# Patient Record
Sex: Male | Born: 1945 | Race: White | Hispanic: No | Smoking: Former smoker
Health system: Southern US, Community
[De-identification: ages and names within clinical notes are randomized; demographics above are authoritative.]

## PROBLEM LIST (undated history)

## (undated) DIAGNOSIS — I219 Acute myocardial infarction, unspecified: Secondary | ICD-10-CM

## (undated) DIAGNOSIS — Z952 Presence of prosthetic heart valve: Secondary | ICD-10-CM

## (undated) DIAGNOSIS — C61 Malignant neoplasm of prostate: Secondary | ICD-10-CM

## (undated) DIAGNOSIS — T7840XA Allergy, unspecified, initial encounter: Secondary | ICD-10-CM

## (undated) DIAGNOSIS — I2510 Atherosclerotic heart disease of native coronary artery without angina pectoris: Secondary | ICD-10-CM

## (undated) DIAGNOSIS — F329 Major depressive disorder, single episode, unspecified: Secondary | ICD-10-CM

## (undated) DIAGNOSIS — C679 Malignant neoplasm of bladder, unspecified: Secondary | ICD-10-CM

## (undated) DIAGNOSIS — I714 Abdominal aortic aneurysm, without rupture: Secondary | ICD-10-CM

## (undated) DIAGNOSIS — F32A Depression, unspecified: Secondary | ICD-10-CM

## (undated) DIAGNOSIS — I4891 Unspecified atrial fibrillation: Secondary | ICD-10-CM

## (undated) HISTORY — PX: CARDIAC SURGERY: SHX584

## (undated) HISTORY — DX: Allergy, unspecified, initial encounter: T78.40XA

## (undated) HISTORY — PX: BLADDER SURGERY: SHX569

## (undated) HISTORY — DX: Malignant neoplasm of prostate: C61

---

## 2009-04-26 DIAGNOSIS — Z952 Presence of prosthetic heart valve: Secondary | ICD-10-CM

## 2009-04-26 HISTORY — DX: Presence of prosthetic heart valve: Z95.2

## 2014-09-30 ENCOUNTER — Telehealth: Payer: Self-pay | Admitting: Surgery

## 2014-09-30 NOTE — Telephone Encounter (Signed)
When calling the pt to advise of sx date, Pt stated that he would like to cancel sx until later in the year (September). Pt stated that he will call when he is ready to reschedule. Dr Burt Knack, OR and pre admit have been informed of cancellation.

## 2014-10-01 ENCOUNTER — Other Ambulatory Visit: Payer: Self-pay

## 2014-10-03 ENCOUNTER — Other Ambulatory Visit: Payer: Self-pay

## 2014-10-11 ENCOUNTER — Ambulatory Visit: Admission: RE | Admit: 2014-10-11 | Payer: Self-pay | Source: Ambulatory Visit | Admitting: Surgery

## 2014-10-11 ENCOUNTER — Encounter: Admission: RE | Payer: Self-pay | Source: Ambulatory Visit

## 2014-10-11 ENCOUNTER — Ambulatory Visit: Admit: 2014-10-11 | Payer: Self-pay | Admitting: Surgery

## 2014-10-11 SURGERY — REPAIR, HERNIA, UMBILICAL, ADULT
Anesthesia: Choice

## 2016-09-01 DIAGNOSIS — C679 Malignant neoplasm of bladder, unspecified: Secondary | ICD-10-CM | POA: Insufficient documentation

## 2016-09-01 NOTE — Progress Notes (Signed)
Gabriel Robbins  [phone] [phone]  ID: Gabriel Robbins OB: April 24, 1946  MR#: [id number]  EQA#:[id number]  Patient Care Team: [name], NP as PCP - General (Family Medicine)  CHIEF COMPLAINT: Stage IIIA high-grade urothelial carcinoma.  INTERVAL HISTORY: Patient is a 71 year old male who initially presented to the [facility] with hematuria. Subsequent workup included CT scan as well as cystoscopy that revealed the above stated bladder cancer. Patient is status post TURBT. Prostatic biopsy was also positive for high-grade urothelial carcinoma. Currently, he feels well and is asymptomatic. He has no neurologic complaints. He denies any recent fevers or illnesses. He has a good appetite and denies weight loss. He has no chest pain or shortness of breath. He denies any nausea, vomiting, constipation, or diarrhea. He has no further urinary complaints. Patient feels at his baseline and offers no specific complaints today.  REVIEW OF SYSTEMS:   Review of Systems  Constitutional: Negative.  Negative for fever, malaise/fatigue and weight loss.  Respiratory: Negative.  Negative for shortness of breath.   Cardiovascular: Negative.  Negative for chest pain and leg swelling.  Gastrointestinal: Negative.  Negative for abdominal pain.  Genitourinary: Positive for hematuria.  Musculoskeletal: Negative.   Skin: Negative.  Negative for rash.  Neurological: Negative.  Negative for weakness.  Psychiatric/Behavioral: Negative.  The patient is not nervous/anxious.     As per HPI. Otherwise, a complete review of systems is negative.  PAST MEDICAL HISTORY: Past Medical History:  Diagnosis Date  . Allergy   . Prostate cancer ([place])     PAST SURGICAL HISTORY: Past Surgical History:  Procedure Laterality Date  . BLADDER SURGERY    . CARDIAC SURGERY      FAMILY HISTORY: Family History  Problem Relation Age of Onset  . Thyroid disease Mother   .  Heart attack Father   . Heart disease Brother   . Parkinson's disease Brother     ADVANCED DIRECTIVES (Y/N):  N  HEALTH MAINTENANCE: Social History  Substance Use Topics  . Smoking status: Current Every Day Smoker    Packs/day: 0.25    Years: 50.00  . Smokeless tobacco: Never Used  . Alcohol use Yes     Comment: very rare     Colonoscopy:  PAP:  Bone density:  Lipid panel:  Allergies  Allergen Reactions  . Penicillin G Other (See Comments)    Current Outpatient Prescriptions  Medication Sig Dispense Refill  . aspirin EC 81 MG tablet Take 81 mg by mouth daily.     Marland Kitchen atorvastatin (LIPITOR) 20 MG tablet Take 20 mg by mouth daily.    . carvedilol (COREG) 25 MG tablet Take 25 mg by mouth 2 (two) times daily with a meal.     . tamsulosin (FLOMAX) 0.4 MG CAPS capsule Take 0.4 mg by mouth 2 (two) times daily.     No current facility-administered medications for this visit.     OBJECTIVE: Vitals:   09/03/16 0852  BP: 132/83  Pulse: 70  Resp: 18  Temp: 98.2 F (36.8 C)     Body mass index is 28.75 kg/m.    ECOG FS:0 - Asymptomatic  General: Well-developed, well-nourished, no acute distress. Eyes: Pink conjunctiva, anicteric sclera. HEENT: Normocephalic, moist mucous membranes, clear oropharnyx. Lungs: Clear to auscultation bilaterally. Heart: Regular rate and rhythm. No rubs, murmurs, or gallops. Abdomen: Soft, nontender, nondistended. No organomegaly noted, normoactive bowel sounds. Musculoskeletal: No edema, cyanosis, or clubbing. Neuro: Alert, answering all questions appropriately. Cranial  nerves grossly intact. Skin: No rashes or petechiae noted. Psych: Normal affect. Lymphatics: No cervical, calvicular, axillary or inguinal LAD.   LAB RESULTS:  No results found for: NA, K, CL, CO2, GLUCOSE, BUN, CREATININE, CALCIUM, PROT, ALBUMIN, AST, ALT, ALKPHOS, BILITOT, GFRNONAA, GFRAA  No results found for: WBC, NEUTROABS, HGB, HCT, MCV, PLT   STUDIES: No results  found.  ASSESSMENT: Stage IIIA high-grade urothelial carcinoma.  PLAN:    1. Stage IIIA high-grade urothelial carcinoma: Outside CT scans and biopsy results reviewed independently confirming diagnosis and stage. Patient reports he has a PET scan scheduled at the [place] next week to complete the staging workup. We will obtain the slides from his biopsy and imaging results. Agree with outside recommendation of neoadjuvant chemotherapy using cisplatin and gemcitabine for 3 cycles followed by complete cystectomy. Currently, patient is scheduled for surgery at the [facility] but is considering a local urologist. A referral will be made later at the patient's request. Patient will receive cisplatin and gemcitabine on day 1 followed by gemcitabine only on days 8 and day 15. This will be a 28 day cycle. Patient has requested to delay treatment several weeks in order to complete multiple previously scheduled appointments. Therefore, he will return to clinic on September 29, 2016 to initiate cycle 1, day 1.  Approximately 60 minutes was spent in discussion of which greater than 50% was consultation.  Patient expressed understanding and was in agreement with this plan. He also understands that He can call clinic at any time with any questions, concerns, or complaints.   Cancer Staging Bladder cancer [facility]) Staging form: Urinary Bladder, AJCC 8th Edition - Clinical stage from 09/04/2016: Stage IIIA (cT4a, cN0, cM0) - Signed by [name], MD on 09/04/2016   [name], MD   09/04/2016 8:49 AM

## 2016-09-03 ENCOUNTER — Inpatient Hospital Stay: Payer: Non-veteran care | Attending: Oncology | Admitting: Oncology

## 2016-09-03 ENCOUNTER — Ambulatory Visit: Payer: Self-pay | Admitting: Oncology

## 2016-09-03 ENCOUNTER — Encounter: Payer: Self-pay | Admitting: Oncology

## 2016-09-03 DIAGNOSIS — R319 Hematuria, unspecified: Secondary | ICD-10-CM | POA: Insufficient documentation

## 2016-09-03 DIAGNOSIS — F17210 Nicotine dependence, cigarettes, uncomplicated: Secondary | ICD-10-CM | POA: Diagnosis not present

## 2016-09-03 DIAGNOSIS — Z7982 Long term (current) use of aspirin: Secondary | ICD-10-CM | POA: Insufficient documentation

## 2016-09-03 DIAGNOSIS — C680 Malignant neoplasm of urethra: Secondary | ICD-10-CM | POA: Diagnosis not present

## 2016-09-03 DIAGNOSIS — Z8546 Personal history of malignant neoplasm of prostate: Secondary | ICD-10-CM | POA: Insufficient documentation

## 2016-09-03 DIAGNOSIS — C679 Malignant neoplasm of bladder, unspecified: Secondary | ICD-10-CM

## 2016-09-04 NOTE — Progress Notes (Signed)
START ON PATHWAY REGIMEN - Bladder     A cycle is every 21 days:     Gemcitabine      Cisplatin   **Always confirm dose/schedule in your pharmacy ordering system**    Patient Characteristics: Pre Cystectomy, Clinical T2-T4a, N0-1, M0, Cystectomy Eligible, Cisplatin-Based Chemotherapy Indicated (CrCl >= 50 mL/min and Minimal or No Symptoms) AJCC M Category: M0 AJCC N Category: N0 AJCC T Category: T4a Current evidence of distant metastases? No AJCC 8 Stage Grouping: IIIA  Intent of Therapy: Curative Intent, Discussed with Patient

## 2016-09-06 ENCOUNTER — Telehealth: Payer: Self-pay

## 2016-09-06 NOTE — Telephone Encounter (Signed)
-----   Message from [name], MD sent at 09/06/2016  8:55 AM EDT ----- Regarding: RE: port placement He was going to get back to me.  If he doesn't let us know by the end of the week, could you give him a call?  Thanks!  ----- Message ----- From: Luretha Murphy, CMA Sent: 09/06/2016   8:11 AM To: [name], MD Subject: port placement                                 Did you want patient to have a port placed if so where?

## 2016-09-06 NOTE — Addendum Note (Signed)
Addended by: Luretha Murphy on: 09/06/2016 09:47 AM   Modules accepted: Orders

## 2016-09-06 NOTE — Telephone Encounter (Signed)
Referral placed to [facility] for port placement tried calling patient but I left a voicemail

## 2016-09-07 ENCOUNTER — Encounter (INDEPENDENT_AMBULATORY_CARE_PROVIDER_SITE_OTHER): Payer: Self-pay

## 2016-09-07 ENCOUNTER — Ambulatory Visit
Admission: RE | Admit: 2016-09-07 | Discharge: 2016-09-07 | Disposition: A | Discharge status: Home / Self Care | Payer: Self-pay | Source: Ambulatory Visit | Attending: Oncology | Admitting: Oncology

## 2016-09-07 ENCOUNTER — Other Ambulatory Visit: Payer: Self-pay | Admitting: Oncology

## 2016-09-07 DIAGNOSIS — C679 Malignant neoplasm of bladder, unspecified: Secondary | ICD-10-CM

## 2016-09-14 NOTE — Patient Instructions (Signed)
Cisplatin injection What is this medicine? CISPLATIN (SIS pla tin) is a chemotherapy drug. It targets fast dividing cells, like cancer cells, and causes these cells to die. This medicine is used to treat many types of cancer like bladder, ovarian, and testicular cancers. This medicine may be used for other purposes; ask your health care provider or pharmacist if you have questions. COMMON BRAND NAME(S): Platinol, Platinol -AQ What should I tell my health care provider before I take this medicine? They need to know if you have any of these conditions: -blood disorders -hearing problems -kidney disease -recent or ongoing radiation therapy -an unusual or allergic reaction to cisplatin, carboplatin, other chemotherapy, other medicines, foods, dyes, or preservatives -pregnant or trying to get pregnant -breast-feeding How should I use this medicine? This drug is given as an infusion into a vein. It is administered in a hospital or clinic by a specially trained health care professional. Talk to your pediatrician regarding the use of this medicine in children. Special care may be needed. Overdosage: If you think you have taken too much of this medicine contact a poison control center or emergency room at once. NOTE: This medicine is only for you. Do not share this medicine with others. What if I miss a dose? It is important not to miss a dose. Call your doctor or health care professional if you are unable to keep an appointment. What may interact with this medicine? -dofetilide -foscarnet -medicines for seizures -medicines to increase blood counts like filgrastim, pegfilgrastim, sargramostim -probenecid -pyridoxine used with altretamine -rituximab -some antibiotics like amikacin, gentamicin, neomycin, polymyxin B, streptomycin, tobramycin -sulfinpyrazone -vaccines -zalcitabine Talk to your doctor or health care professional before taking any of these  medicines: -acetaminophen -aspirin -ibuprofen -ketoprofen -naproxen This list may not describe all possible interactions. Give your health care provider a list of all the medicines, herbs, non-prescription drugs, or dietary supplements you use. Also tell them if you smoke, drink alcohol, or use illegal drugs. Some items may interact with your medicine. What should I watch for while using this medicine? Your condition will be monitored carefully while you are receiving this medicine. You will need important blood work done while you are taking this medicine. This drug may make you feel generally unwell. This is not uncommon, as chemotherapy can affect healthy cells as well as cancer cells. Report any side effects. Continue your course of treatment even though you feel ill unless your doctor tells you to stop. In some cases, you may be given additional medicines to help with side effects. Follow all directions for their use. Call your doctor or health care professional for advice if you get a fever, chills or sore throat, or other symptoms of a cold or flu. Do not treat yourself. This drug decreases your body's ability to fight infections. Try to avoid being around people who are sick. This medicine may increase your risk to bruise or bleed. Call your doctor or health care professional if you notice any unusual bleeding. Be careful brushing and flossing your teeth or using a toothpick because you may get an infection or bleed more easily. If you have any dental work done, tell your dentist you are receiving this medicine. Avoid taking products that contain aspirin, acetaminophen, ibuprofen, naproxen, or ketoprofen unless instructed by your doctor. These medicines may hide a fever. Do not become pregnant while taking this medicine. Women should inform their doctor if they wish to become pregnant or think they might be pregnant. There is a   potential for serious side effects to an unborn child. Talk to  your health care professional or pharmacist for more information. Do not breast-feed an infant while taking this medicine. Drink fluids as directed while you are taking this medicine. This will help protect your kidneys. Call your doctor or health care professional if you get diarrhea. Do not treat yourself. What side effects may I notice from receiving this medicine? Side effects that you should report to your doctor or health care professional as soon as possible: -allergic reactions like skin rash, itching or hives, swelling of the face, lips, or tongue -signs of infection - fever or chills, cough, sore throat, pain or difficulty passing urine -signs of decreased platelets or bleeding - bruising, pinpoint red spots on the skin, black, tarry stools, nosebleeds -signs of decreased red blood cells - unusually weak or tired, fainting spells, lightheadedness -breathing problems -changes in hearing -gout pain -low blood counts - This drug may decrease the number of white blood cells, red blood cells and platelets. You may be at increased risk for infections and bleeding. -nausea and vomiting -pain, swelling, redness or irritation at the injection site -pain, tingling, numbness in the hands or feet -problems with balance, movement -trouble passing urine or change in the amount of urine Side effects that usually do not require medical attention (report to your doctor or health care professional if they continue or are bothersome): -changes in vision -loss of appetite -metallic taste in the mouth or changes in taste This list may not describe all possible side effects. Call your doctor for medical advice about side effects. You may report side effects to FDA at 1-800-FDA-1088. Where should I keep my medicine? This drug is given in a hospital or clinic and will not be stored at home. NOTE: This sheet is a summary. It may not cover all possible information. If you have questions about this medicine,  talk to your doctor, pharmacist, or health care provider.  2018 Elsevier/Gold Standard (2007-07-18 14:40:54) Gemcitabine injection What is this medicine? GEMCITABINE (jem SIT a been) is a chemotherapy drug. This medicine is used to treat many types of cancer like breast cancer, lung cancer, pancreatic cancer, and ovarian cancer. This medicine may be used for other purposes; ask your health care provider or pharmacist if you have questions. COMMON BRAND NAME(S): Gemzar What should I tell my health care provider before I take this medicine? They need to know if you have any of these conditions: -blood disorders -infection -kidney disease -liver disease -recent or ongoing radiation therapy -an unusual or allergic reaction to gemcitabine, other chemotherapy, other medicines, foods, dyes, or preservatives -pregnant or trying to get pregnant -breast-feeding How should I use this medicine? This drug is given as an infusion into a vein. It is administered in a hospital or clinic by a specially trained health care professional. Talk to your pediatrician regarding the use of this medicine in children. Special care may be needed. Overdosage: If you think you have taken too much of this medicine contact a poison control center or emergency room at once. NOTE: This medicine is only for you. Do not share this medicine with others. What if I miss a dose? It is important not to miss your dose. Call your doctor or health care professional if you are unable to keep an appointment. What may interact with this medicine? -medicines to increase blood counts like filgrastim, pegfilgrastim, sargramostim -some other chemotherapy drugs like cisplatin -vaccines Talk to your doctor or health   care professional before taking any of these medicines: -acetaminophen -aspirin -ibuprofen -ketoprofen -naproxen This list may not describe all possible interactions. Give your health care provider a list of all the  medicines, herbs, non-prescription drugs, or dietary supplements you use. Also tell them if you smoke, drink alcohol, or use illegal drugs. Some items may interact with your medicine. What should I watch for while using this medicine? Visit your doctor for checks on your progress. This drug may make you feel generally unwell. This is not uncommon, as chemotherapy can affect healthy cells as well as cancer cells. Report any side effects. Continue your course of treatment even though you feel ill unless your doctor tells you to stop. In some cases, you may be given additional medicines to help with side effects. Follow all directions for their use. Call your doctor or health care professional for advice if you get a fever, chills or sore throat, or other symptoms of a cold or flu. Do not treat yourself. This drug decreases your body's ability to fight infections. Try to avoid being around people who are sick. This medicine may increase your risk to bruise or bleed. Call your doctor or health care professional if you notice any unusual bleeding. Be careful brushing and flossing your teeth or using a toothpick because you may get an infection or bleed more easily. If you have any dental work done, tell your dentist you are receiving this medicine. Avoid taking products that contain aspirin, acetaminophen, ibuprofen, naproxen, or ketoprofen unless instructed by your doctor. These medicines may hide a fever. Women should inform their doctor if they wish to become pregnant or think they might be pregnant. There is a potential for serious side effects to an unborn child. Talk to your health care professional or pharmacist for more information. Do not breast-feed an infant while taking this medicine. What side effects may I notice from receiving this medicine? Side effects that you should report to your doctor or health care professional as soon as possible: -allergic reactions like skin rash, itching or hives,  swelling of the face, lips, or tongue -low blood counts - this medicine may decrease the number of white blood cells, red blood cells and platelets. You may be at increased risk for infections and bleeding. -signs of infection - fever or chills, cough, sore throat, pain or difficulty passing urine -signs of decreased platelets or bleeding - bruising, pinpoint red spots on the skin, black, tarry stools, blood in the urine -signs of decreased red blood cells - unusually weak or tired, fainting spells, lightheadedness -breathing problems -chest pain -mouth sores -nausea and vomiting -pain, swelling, redness at site where injected -pain, tingling, numbness in the hands or feet -stomach pain -swelling of ankles, feet, hands -unusual bleeding Side effects that usually do not require medical attention (report to your doctor or health care professional if they continue or are bothersome): -constipation -diarrhea -hair loss -loss of appetite -stomach upset This list may not describe all possible side effects. Call your doctor for medical advice about side effects. You may report side effects to FDA at 1-800-FDA-1088. Where should I keep my medicine? This drug is given in a hospital or clinic and will not be stored at home. NOTE: This sheet is a summary. It may not cover all possible information. If you have questions about this medicine, talk to your doctor, pharmacist, or health care provider.  2018 Elsevier/Gold Standard (2007-08-22 18:45:54)  

## 2016-09-15 ENCOUNTER — Inpatient Hospital Stay: Payer: Non-veteran care

## 2016-09-21 ENCOUNTER — Telehealth (INDEPENDENT_AMBULATORY_CARE_PROVIDER_SITE_OTHER): Payer: Self-pay

## 2016-09-21 ENCOUNTER — Other Ambulatory Visit (INDEPENDENT_AMBULATORY_CARE_PROVIDER_SITE_OTHER): Payer: Self-pay | Admitting: Vascular Surgery

## 2016-09-21 MED ORDER — CLINDAMYCIN PHOSPHATE 600 MG/50ML IV SOLN
600.0000 mg | Freq: Once | INTRAVENOUS | Status: AC
  Administered 2016-09-22: 600 mg via INTRAVENOUS
  Filled 2016-09-21: qty 50

## 2016-09-21 NOTE — Telephone Encounter (Signed)
Attempted to contact patient to reschedule his port placement that is scheduled for 09/27/16 to 09/22/16 due to the doctor not being available on that day. Left a message for patient to contact me.

## 2016-09-21 NOTE — Telephone Encounter (Signed)
I have spoken with the patient and rescheduled his procedure for 09/22/16.

## 2016-09-22 ENCOUNTER — Encounter
Admission: RE | Disposition: A | Discharge status: Home / Self Care | Payer: Self-pay | Source: Ambulatory Visit | Attending: Vascular Surgery

## 2016-09-22 ENCOUNTER — Ambulatory Visit
Admission: RE | Admit: 2016-09-22 | Discharge: 2016-09-22 | Disposition: A | Discharge status: Home / Self Care | Payer: Medicare Other | Source: Ambulatory Visit | Attending: Vascular Surgery | Admitting: Vascular Surgery

## 2016-09-22 DIAGNOSIS — Z7982 Long term (current) use of aspirin: Secondary | ICD-10-CM | POA: Insufficient documentation

## 2016-09-22 DIAGNOSIS — Z8349 Family history of other endocrine, nutritional and metabolic diseases: Secondary | ICD-10-CM | POA: Insufficient documentation

## 2016-09-22 DIAGNOSIS — Z8249 Family history of ischemic heart disease and other diseases of the circulatory system: Secondary | ICD-10-CM | POA: Insufficient documentation

## 2016-09-22 DIAGNOSIS — Z8546 Personal history of malignant neoplasm of prostate: Secondary | ICD-10-CM | POA: Insufficient documentation

## 2016-09-22 DIAGNOSIS — F17210 Nicotine dependence, cigarettes, uncomplicated: Secondary | ICD-10-CM | POA: Insufficient documentation

## 2016-09-22 DIAGNOSIS — C679 Malignant neoplasm of bladder, unspecified: Secondary | ICD-10-CM

## 2016-09-22 DIAGNOSIS — Z820 Family history of epilepsy and other diseases of the nervous system: Secondary | ICD-10-CM | POA: Insufficient documentation

## 2016-09-22 DIAGNOSIS — Z9889 Other specified postprocedural states: Secondary | ICD-10-CM | POA: Insufficient documentation

## 2016-09-22 DIAGNOSIS — Z880 Allergy status to penicillin: Secondary | ICD-10-CM | POA: Insufficient documentation

## 2016-09-22 HISTORY — DX: Acute myocardial infarction, unspecified: I21.9

## 2016-09-22 HISTORY — DX: Depression, unspecified: F32.A

## 2016-09-22 HISTORY — DX: Major depressive disorder, single episode, unspecified: F32.9

## 2016-09-22 HISTORY — PX: PORTA CATH INSERTION: CATH118285

## 2016-09-22 HISTORY — DX: Atherosclerotic heart disease of native coronary artery without angina pectoris: I25.10

## 2016-09-22 SURGERY — PORTA CATH INSERTION
Anesthesia: Moderate Sedation

## 2016-09-22 MED ORDER — MIDAZOLAM HCL 2 MG/2ML IJ SOLN
INTRAMUSCULAR | Status: DC | PRN
  Administered 2016-09-22: 2 mg via INTRAVENOUS

## 2016-09-22 MED ORDER — DIPHENHYDRAMINE HCL 50 MG/ML IJ SOLN
INTRAMUSCULAR | Status: AC
  Filled 2016-09-22: qty 1

## 2016-09-22 MED ORDER — LIDOCAINE-EPINEPHRINE (PF) 2 %-1:200000 IJ SOLN
INTRAMUSCULAR | Status: AC
  Filled 2016-09-22: qty 20

## 2016-09-22 MED ORDER — OXYCODONE-ACETAMINOPHEN 5-325 MG PO TABS: 1.0000 | ORAL_TABLET | ORAL | Status: DC | PRN

## 2016-09-22 MED ORDER — PHENOL 1.4 % MT LIQD: 1.0000 | OROMUCOSAL | Status: DC | PRN

## 2016-09-22 MED ORDER — ALUM & MAG HYDROXIDE-SIMETH 200-200-20 MG/5ML PO SUSP: 15.0000 mL | ORAL | Status: DC | PRN

## 2016-09-22 MED ORDER — METOPROLOL TARTRATE 5 MG/5ML IV SOLN: 2.0000 mg | INTRAVENOUS | Status: DC | PRN

## 2016-09-22 MED ORDER — ACETAMINOPHEN 325 MG RE SUPP: 325.0000 mg | RECTAL | Status: DC | PRN

## 2016-09-22 MED ORDER — MORPHINE SULFATE (PF) 4 MG/ML IV SOLN: 2.0000 mg | INTRAVENOUS | Status: DC | PRN

## 2016-09-22 MED ORDER — SODIUM CHLORIDE 0.9 % IV SOLN
INTRAVENOUS | Status: DC
  Administered 2016-09-22: 07:00:00 via INTRAVENOUS

## 2016-09-22 MED ORDER — HYDROMORPHONE HCL 1 MG/ML IJ SOLN: 1.0000 mg | Freq: Once | INTRAMUSCULAR | Status: DC | PRN

## 2016-09-22 MED ORDER — SODIUM CHLORIDE 0.9 % IV SOLN: 500.0000 mL | Freq: Once | INTRAVENOUS | Status: DC | PRN

## 2016-09-22 MED ORDER — GUAIFENESIN-DM 100-10 MG/5ML PO SYRP: 15.0000 mL | ORAL_SOLUTION | ORAL | Status: DC | PRN

## 2016-09-22 MED ORDER — LABETALOL HCL 5 MG/ML IV SOLN: 10.0000 mg | INTRAVENOUS | Status: DC | PRN

## 2016-09-22 MED ORDER — DIPHENHYDRAMINE HCL 50 MG/ML IJ SOLN
INTRAMUSCULAR | Status: DC | PRN
  Administered 2016-09-22: 25 mg via INTRAVENOUS

## 2016-09-22 MED ORDER — MIDAZOLAM HCL 5 MG/5ML IJ SOLN
INTRAMUSCULAR | Status: AC
  Filled 2016-09-22: qty 5

## 2016-09-22 MED ORDER — CLINDAMYCIN PHOSPHATE 300 MG/50ML IV SOLN
INTRAVENOUS | Status: AC
  Filled 2016-09-22: qty 50

## 2016-09-22 MED ORDER — HYDRALAZINE HCL 20 MG/ML IJ SOLN: 5.0000 mg | INTRAMUSCULAR | Status: DC | PRN

## 2016-09-22 MED ORDER — SODIUM CHLORIDE 0.9 % IR SOLN
Freq: Once | Status: AC
  Administered 2016-09-22: 09:00:00
  Filled 2016-09-22: qty 2

## 2016-09-22 MED ORDER — ONDANSETRON HCL 4 MG/2ML IJ SOLN: 4.0000 mg | Freq: Four times a day (QID) | INTRAMUSCULAR | Status: DC | PRN

## 2016-09-22 MED ORDER — ACETAMINOPHEN 325 MG PO TABS: 325.0000 mg | ORAL_TABLET | ORAL | Status: DC | PRN

## 2016-09-22 MED ORDER — ONDANSETRON HCL 4 MG/2ML IJ SOLN
4.0000 mg | Freq: Four times a day (QID) | INTRAMUSCULAR | Status: DC | PRN
Start: 2016-09-22 — End: 2016-09-22

## 2016-09-22 SURGICAL SUPPLY — 9 items
DERMABOND ADVANCED (GAUZE/BANDAGES/DRESSINGS) ×2
DERMABOND ADVANCED .7 DNX12 (GAUZE/BANDAGES/DRESSINGS) ×1 IMPLANT
KIT PORT POWER 8FR ISP CVUE (Catheter) ×3 IMPLANT
PACK ANGIOGRAPHY (CUSTOM PROCEDURE TRAY) ×3 IMPLANT
PAD GROUND ADULT SPLIT (MISCELLANEOUS) ×3 IMPLANT
PENCIL ELECTRO HAND CTR (MISCELLANEOUS) ×3 IMPLANT
SUT MNCRL AB 4-0 PS2 18 (SUTURE) ×3 IMPLANT
SUT PROLENE 0 CT 1 30 (SUTURE) ×3 IMPLANT
SUTURE VIC 3-0 (SUTURE) ×3 IMPLANT

## 2016-09-22 NOTE — Progress Notes (Signed)
Pt drank 3 large sips of coffee with cream, [facility] lab aware and paging Dr Lucky Cowboy.

## 2016-09-22 NOTE — Op Note (Signed)
      [facility]       Operative Note  Date: 09/22/2016  Preoperative diagnosis:  1. Bladder cancer  Postoperative diagnosis:  Same as above  Procedures: #1. Ultrasound guidance for vascular access to the right internal jugular vein. #2. Fluoroscopic guidance for placement of catheter. #3. Placement of CT compatible Port-A-Cath, right internal jugular vein.  Surgeon: Leotis Pain, MD.   Anesthesia: Local with moderate conscious sedation for approximately 20  minutes using 2 mg of Versed and 25 mg of Benadryl  Fluoroscopy time: less than 1 minute  Contrast used: 0  Estimated blood loss: 10 cc  Indication for the procedure:  The patient is a 71 y.o.male with bladder cancer.  The patient needs a Port-A-Cath for durable venous access, chemotherapy, lab draws, and CT scans. We are asked to place this. Risks and benefits were discussed and informed consent was obtained.  Description of procedure: The patient was brought to the vascular and interventional radiology suite.  Moderate conscious sedation was administered throughout the procedure during a face to face encounter with the patient with my supervision of the RN administering medicines and monitoring the patient's vital signs, pulse oximetry, telemetry and mental status throughout from the start of the procedure until the patient was taken to the recovery room. The right neck chest and shoulder were sterilely prepped and draped, and a sterile surgical field was created. Ultrasound was used to help visualize a patent right internal jugular vein. This was then accessed under direct ultrasound guidance without difficulty with the Seldinger needle and a permanent image was recorded. A J-wire was placed. After skin nick and dilatation, the peel-away sheath was then placed over the wire. I then anesthetized an area under the clavicle approximately 1-2 fingerbreadths. A transverse incision was created and an inferior pocket was  created with electrocautery and blunt dissection. The port was then brought onto the field, placed into the pocket and secured to the chest wall with 2 Prolene sutures. The catheter was connected to the port and tunneled from the subclavicular incision to the access site. Fluoroscopic guidance was then used to cut the catheter to an appropriate length. The catheter was then placed through the peel-away sheath and the peel-away sheath was removed. The catheter tip was parked in excellent location under fluorocoscopic guidance in the cavoatrial junction. The pocket was then irrigated with antibiotic impregnated saline and the wound was closed with a running 3-0 Vicryl and a 4-0 Monocryl. The access incision was closed with a single 4-0 Monocryl. The Huber needle was used to withdraw blood and flush the port with heparinized saline. Dermabond was then placed as a dressing. The patient tolerated the procedure well and was taken to the recovery room in stable condition.   Leotis Pain 09/22/2016 9:03 AM   This note was created with Dragon Medical transcription system. Any errors in dictation are purely unintentional.

## 2016-09-22 NOTE — Progress Notes (Signed)
Call back , pt to be given versed only for procedure

## 2016-09-22 NOTE — H&P (Signed)
[facility] History & Physical Update  The patient was interviewed and re-examined.  The patient's previous History and Physical has been reviewed and is unchanged.  There is no change in the plan of care. We plan to proceed with the scheduled procedure.  Leotis Pain, MD  09/22/2016, 7:53 AM

## 2016-09-23 ENCOUNTER — Inpatient Hospital Stay: Payer: Non-veteran care

## 2016-09-23 ENCOUNTER — Encounter: Payer: Self-pay | Admitting: Vascular Surgery

## 2016-09-28 NOTE — Progress Notes (Signed)
[place]  [phone]) [phone] Fax:(336) [phone]  ID: Gabriel Robbins OB: Jan 13, 1946  MR#: [id number]  ZTI#:[id number]  Patient Care Team: [name], NP as PCP - General (Family Medicine)  CHIEF COMPLAINT: Stage IIIA high-grade urothelial carcinoma.  INTERVAL HISTORY: Patient returns to clinic today for further evaluation and consideration of cycle 1, day 1 of cisplatin and gemcitabine. He continues to feel well and at his baseline. He has no neurologic complaints. He denies any recent fevers or illnesses. He has a good appetite and denies weight loss. He has no chest pain or shortness of breath. He denies any nausea, vomiting, constipation, or diarrhea. He has no further urinary complaints. Patient offers no specific complaints today.  REVIEW OF SYSTEMS:   Review of Systems  Constitutional: Negative.  Negative for fever, malaise/fatigue and weight loss.  Respiratory: Negative.  Negative for shortness of breath.   Cardiovascular: Negative.  Negative for chest pain and leg swelling.  Gastrointestinal: Negative.  Negative for abdominal pain.  Genitourinary: Positive for hematuria.  Musculoskeletal: Negative.   Skin: Negative.  Negative for rash.  Neurological: Negative.  Negative for weakness.  Psychiatric/Behavioral: Negative.  The patient is not nervous/anxious.     As per HPI. Otherwise, a complete review of systems is negative.  PAST MEDICAL HISTORY: Past Medical History:  Diagnosis Date  . Allergy   . Coronary artery disease   . Depression   . Hypertension   . Myocardial infarction ([place])   . Prostate cancer ([place])     PAST SURGICAL HISTORY: Past Surgical History:  Procedure Laterality Date  . BLADDER SURGERY    . CARDIAC SURGERY    . PORTA CATH INSERTION N/A 09/22/2016   Procedure: Glori Luis Cath Insertion;  Surgeon: Algernon Huxley, MD;  Location: [facility];  Service: Cardiovascular;  Laterality: N/A;    FAMILY HISTORY: Family  History  Problem Relation Age of Onset  . Thyroid disease Mother   . Heart attack Father   . Heart disease Brother   . Parkinson's disease Brother     ADVANCED DIRECTIVES (Y/N):  N  HEALTH MAINTENANCE: Social History  Substance Use Topics  . Smoking status: Current Every Day Smoker    Packs/day: 0.50    Years: 50.00  . Smokeless tobacco: Never Used  . Alcohol use Yes     Comment: very rare     Colonoscopy:  PAP:  Bone density:  Lipid panel:  Allergies  Allergen Reactions  . Penicillin G Other (See Comments)    Current Outpatient Prescriptions  Medication Sig Dispense Refill  . acetaminophen (TYLENOL) 325 MG tablet Take 650 mg by mouth 2 (two) times daily as needed for mild pain or moderate pain.    Marland Kitchen albuterol (PROVENTIL HFA;VENTOLIN HFA) 108 (90 Base) MCG/ACT inhaler Inhale 2 puffs into the lungs every 4 (four) hours as needed for wheezing or shortness of breath.    Marland Kitchen ammonium lactate (LAC-HYDRIN) 12 % lotion Apply 1 application topically daily as needed for dry skin.    Marland Kitchen atorvastatin (LIPITOR) 40 MG tablet Take 20 mg by mouth at bedtime.     . carvedilol (COREG) 25 MG tablet Take 25 mg by mouth 2 (two) times daily with a meal.     . cetirizine (ZYRTEC) 10 MG tablet Take 10 mg by mouth daily.    . diclofenac sodium (VOLTAREN) 1 % GEL Apply 1 application topically daily as needed.    . DiphenhydrAMINE HCl (ZZZQUIL) 50 MG/30ML LIQD Take 15  mLs by mouth at bedtime as needed.    . fluticasone (FLONASE) 50 MCG/ACT nasal spray Place 2 sprays into both nostrils daily as needed for allergies or rhinitis.    . polyvinyl alcohol (LIQUIFILM TEARS) 1.4 % ophthalmic solution Place 1 drop into both eyes daily as needed for dry eyes.    . promethazine (PHENERGAN) 50 MG tablet Take 50 mg by mouth every 6 (six) hours as needed for nausea or vomiting.    . sertraline (ZOLOFT) 25 MG tablet Take 25 mg by mouth at bedtime.    . tamsulosin (FLOMAX) 0.4 MG CAPS capsule Take 0.4 mg by mouth 2  (two) times daily.    . Tiotropium Bromide-Olodaterol (STIOLTO RESPIMAT) 2.5-2.5 MCG/ACT AERS Inhale 2 puffs into the lungs every morning.    . traMADol (ULTRAM) 50 MG tablet Take 50 mg by mouth daily as needed for moderate pain.    . vitamin B-12 (CYANOCOBALAMIN) 1000 MCG tablet Take 1,000 mcg by mouth daily.    Marland Kitchen zolpidem (AMBIEN) 5 MG tablet Take 2.5 mg by mouth at bedtime as needed for sleep (VERY RARELY TAKES).    Marland Kitchen aspirin EC 81 MG tablet Take 81 mg by mouth daily.      No current facility-administered medications for this visit.     OBJECTIVE: Vitals:   09/29/16 0908  BP: 128/85  Pulse: 69  Resp: 16  Temp: 97.6 F (36.4 C)     Body mass index is 28.93 kg/m.    ECOG FS:0 - Asymptomatic  General: Well-developed, well-nourished, no acute distress. Eyes: Pink conjunctiva, anicteric sclera. Lungs: Clear to auscultation bilaterally. Heart: Regular rate and rhythm. No rubs, murmurs, or gallops. Abdomen: Soft, nontender, nondistended. No organomegaly noted, normoactive bowel sounds. Musculoskeletal: No edema, cyanosis, or clubbing. Neuro: Alert, answering all questions appropriately. Cranial nerves grossly intact. Skin: No rashes or petechiae noted. Psych: Normal affect.   LAB RESULTS:  Lab Results  Component Value Date   NA 138 09/29/2016   K 3.8 09/29/2016   CL 106 09/29/2016   CO2 27 09/29/2016   GLUCOSE 116 (H) 09/29/2016   BUN 23 (H) 09/29/2016   CREATININE 0.90 09/29/2016   CALCIUM 8.5 (L) 09/29/2016   PROT 6.6 09/29/2016   ALBUMIN 3.6 09/29/2016   AST 18 09/29/2016   ALT 13 (L) 09/29/2016   ALKPHOS 61 09/29/2016   BILITOT 0.7 09/29/2016   GFRNONAA >60 09/29/2016   GFRAA >60 09/29/2016    Lab Results  Component Value Date   WBC 6.3 09/29/2016   NEUTROABS 4.5 09/29/2016   HGB 13.7 09/29/2016   HCT 40.0 09/29/2016   MCV 85.4 09/29/2016   PLT 185 09/29/2016     STUDIES: No results found.  ASSESSMENT: Stage IIIA high-grade urothelial  carcinoma.  PLAN:    1. Stage IIIA high-grade urothelial carcinoma: Outside CT scans and biopsy results reviewed independently confirming diagnosis and stage. PET scan completed at the [facility] and we are trying to obtain these results. We will obtain the slides from his biopsy and imaging results. Agree with outside recommendation of neoadjuvant chemotherapy using cisplatin and gemcitabine for 3 cycles followed by complete cystectomy. Currently, patient is scheduled for surgery at the [facility] but has expressed desire for local urologist and a referral has been made. Patient will receive cisplatin and gemcitabine on day 1 followed by gemcitabine only on days 8 and day 15. This will be a 28 day cycle. Proceed with cycle 1, day 1 today. Return to clinic in 1 week  for consideration of cycle 1, day 8 which will be gemcitabine only. Next  Approximately 30 minutes was spent in discussion of which greater than 50% was consultation.  Patient expressed understanding and was in agreement with this plan. He also understands that He can call clinic at any time with any questions, concerns, or complaints.   Cancer Staging Bladder cancer [facility]) Staging form: Urinary Bladder, AJCC 8th Edition - Clinical stage from 09/04/2016: Stage IIIA (cT4a, cN0, cM0) - Signed by [name], MD on 09/04/2016   [name], MD   10/05/2016 12:50 PM

## 2016-09-29 ENCOUNTER — Inpatient Hospital Stay: Payer: Non-veteran care

## 2016-09-29 ENCOUNTER — Inpatient Hospital Stay: Payer: Non-veteran care | Attending: Oncology | Admitting: Oncology

## 2016-09-29 VITALS — BP 128/85 | HR 69 | Temp 97.6°F | Resp 16 | Wt 213.3 lb

## 2016-09-29 DIAGNOSIS — I2510 Atherosclerotic heart disease of native coronary artery without angina pectoris: Secondary | ICD-10-CM

## 2016-09-29 DIAGNOSIS — Z79899 Other long term (current) drug therapy: Secondary | ICD-10-CM | POA: Diagnosis not present

## 2016-09-29 DIAGNOSIS — C679 Malignant neoplasm of bladder, unspecified: Secondary | ICD-10-CM

## 2016-09-29 DIAGNOSIS — F329 Major depressive disorder, single episode, unspecified: Secondary | ICD-10-CM | POA: Diagnosis not present

## 2016-09-29 DIAGNOSIS — R531 Weakness: Secondary | ICD-10-CM | POA: Diagnosis not present

## 2016-09-29 DIAGNOSIS — F17210 Nicotine dependence, cigarettes, uncomplicated: Secondary | ICD-10-CM | POA: Insufficient documentation

## 2016-09-29 DIAGNOSIS — Z880 Allergy status to penicillin: Secondary | ICD-10-CM | POA: Diagnosis not present

## 2016-09-29 DIAGNOSIS — R5383 Other fatigue: Secondary | ICD-10-CM | POA: Diagnosis not present

## 2016-09-29 DIAGNOSIS — R5381 Other malaise: Secondary | ICD-10-CM | POA: Diagnosis not present

## 2016-09-29 DIAGNOSIS — Z8546 Personal history of malignant neoplasm of prostate: Secondary | ICD-10-CM | POA: Diagnosis not present

## 2016-09-29 DIAGNOSIS — J309 Allergic rhinitis, unspecified: Secondary | ICD-10-CM | POA: Insufficient documentation

## 2016-09-29 DIAGNOSIS — I10 Essential (primary) hypertension: Secondary | ICD-10-CM | POA: Diagnosis not present

## 2016-09-29 DIAGNOSIS — R319 Hematuria, unspecified: Secondary | ICD-10-CM | POA: Insufficient documentation

## 2016-09-29 DIAGNOSIS — D6959 Other secondary thrombocytopenia: Secondary | ICD-10-CM | POA: Diagnosis not present

## 2016-09-29 DIAGNOSIS — C674 Malignant neoplasm of posterior wall of bladder: Secondary | ICD-10-CM

## 2016-09-29 DIAGNOSIS — J449 Chronic obstructive pulmonary disease, unspecified: Secondary | ICD-10-CM | POA: Insufficient documentation

## 2016-09-29 DIAGNOSIS — Z7982 Long term (current) use of aspirin: Secondary | ICD-10-CM | POA: Diagnosis not present

## 2016-09-29 DIAGNOSIS — I252 Old myocardial infarction: Secondary | ICD-10-CM | POA: Insufficient documentation

## 2016-09-29 DIAGNOSIS — T451X5S Adverse effect of antineoplastic and immunosuppressive drugs, sequela: Secondary | ICD-10-CM | POA: Insufficient documentation

## 2016-09-29 DIAGNOSIS — Z5111 Encounter for antineoplastic chemotherapy: Secondary | ICD-10-CM | POA: Diagnosis not present

## 2016-09-29 LAB — CBC WITH DIFFERENTIAL/PLATELET
BASOS ABS: 0 10*3/uL (ref 0–0.1)
BASOS PCT: 1 %
EOS ABS: 0.2 10*3/uL (ref 0–0.7)
EOS PCT: 3 %
HCT: 40 % (ref 40.0–52.0)
Hemoglobin: 13.7 g/dL (ref 13.0–18.0)
Lymphocytes Relative: 16 %
Lymphs Abs: 1 10*3/uL (ref 1.0–3.6)
MCH: 29.3 pg (ref 26.0–34.0)
MCHC: 34.3 g/dL (ref 32.0–36.0)
MCV: 85.4 fL (ref 80.0–100.0)
MONO ABS: 0.5 10*3/uL (ref 0.2–1.0)
Monocytes Relative: 9 %
Neutro Abs: 4.5 10*3/uL (ref 1.4–6.5)
Neutrophils Relative %: 71 %
PLATELETS: 185 10*3/uL (ref 150–440)
RBC: 4.68 MIL/uL (ref 4.40–5.90)
RDW: 13.6 % (ref 11.5–14.5)
WBC: 6.3 10*3/uL (ref 3.8–10.6)

## 2016-09-29 LAB — COMPREHENSIVE METABOLIC PANEL
ALBUMIN: 3.6 g/dL (ref 3.5–5.0)
ALT: 13 U/L — ABNORMAL LOW (ref 17–63)
AST: 18 U/L (ref 15–41)
Alkaline Phosphatase: 61 U/L (ref 38–126)
Anion gap: 5 (ref 5–15)
BUN: 23 mg/dL — AB (ref 6–20)
CHLORIDE: 106 mmol/L (ref 101–111)
CO2: 27 mmol/L (ref 22–32)
Calcium: 8.5 mg/dL — ABNORMAL LOW (ref 8.9–10.3)
Creatinine, Ser: 0.9 mg/dL (ref 0.61–1.24)
GFR calc Af Amer: >60 mL/min (ref >60)
GLUCOSE: 116 mg/dL — AB (ref 65–99)
POTASSIUM: 3.8 mmol/L (ref 3.5–5.1)
Sodium: 138 mmol/L (ref 135–145)
Total Bilirubin: 0.7 mg/dL (ref 0.3–1.2)
Total Protein: 6.6 g/dL (ref 6.5–8.1)

## 2016-09-29 MED ORDER — ACETAMINOPHEN 325 MG PO TABS
650.0000 mg | ORAL_TABLET | Freq: Once | ORAL | Status: AC
  Administered 2016-09-29: 650 mg via ORAL

## 2016-09-29 MED ORDER — SODIUM CHLORIDE 0.9 % IV SOLN
70.0000 mg/m2 | Freq: Once | INTRAVENOUS | Status: AC
  Administered 2016-09-29: 155 mg via INTRAVENOUS
  Filled 2016-09-29: qty 105

## 2016-09-29 MED ORDER — SODIUM CHLORIDE 0.9 % IV SOLN
Freq: Once | INTRAVENOUS | Status: AC
  Administered 2016-09-29: 10:00:00 via INTRAVENOUS
  Filled 2016-09-29: qty 1000

## 2016-09-29 MED ORDER — PALONOSETRON HCL INJECTION 0.25 MG/5ML
0.2500 mg | Freq: Once | INTRAVENOUS | Status: AC
  Administered 2016-09-29: 0.25 mg via INTRAVENOUS
  Filled 2016-09-29: qty 5

## 2016-09-29 MED ORDER — MANNITOL 25 % IV SOLN
Freq: Once | INTRAVENOUS | Status: AC
  Administered 2016-09-29: 10:00:00 via INTRAVENOUS
  Filled 2016-09-29: qty 1000

## 2016-09-29 MED ORDER — HEPARIN SOD (PORK) LOCK FLUSH 100 UNIT/ML IV SOLN
500.0000 [IU] | Freq: Once | INTRAVENOUS | Status: AC
  Administered 2016-09-29: 500 [IU] via INTRAVENOUS
  Filled 2016-09-29: qty 5

## 2016-09-29 MED ORDER — ACETAMINOPHEN 325 MG PO TABS
ORAL_TABLET | ORAL | Status: AC
  Filled 2016-09-29: qty 2

## 2016-09-29 MED ORDER — HEPARIN SOD (PORK) LOCK FLUSH 100 UNIT/ML IV SOLN
INTRAVENOUS | Status: AC
  Filled 2016-09-29: qty 5

## 2016-09-29 MED ORDER — SODIUM CHLORIDE 0.9 % IV SOLN
Freq: Once | INTRAVENOUS | Status: AC
  Administered 2016-09-29: 12:00:00 via INTRAVENOUS
  Filled 2016-09-29: qty 5

## 2016-09-29 MED ORDER — SODIUM CHLORIDE 0.9% FLUSH
10.0000 mL | INTRAVENOUS | Status: DC | PRN
Start: 2016-09-29 — End: 2016-09-29
  Filled 2016-09-29: qty 10

## 2016-09-29 MED ORDER — SODIUM CHLORIDE 0.9 % IV SOLN
2200.0000 mg | Freq: Once | INTRAVENOUS | Status: AC
  Administered 2016-09-29: 2200 mg via INTRAVENOUS
  Filled 2016-09-29: qty 52.6

## 2016-09-29 NOTE — Progress Notes (Signed)
Patient here today for follow up.  Patient states no new concerns today  

## 2016-10-05 NOTE — Progress Notes (Signed)
[place]  Telephone:(336) [phone] Fax:(336) [phone]  ID: Gabriel Robbins OB: 1946-03-18  MR#: [id number]  WGY#:[id number]  Patient Care Team: [name], NP as PCP - General (Family Medicine)  CHIEF COMPLAINT: Stage IIIA high-grade urothelial carcinoma.  INTERVAL HISTORY: Patient returns to clinic today for further evaluation and consideration of cycle 1, day 8 of cisplatin and gemcitabine. He tolerated his first infusion well without significant side effects. He continues to feel well and at his baseline. He has no neurologic complaints. He denies any recent fevers or illnesses. He has a good appetite and denies weight loss. He has no chest pain or shortness of breath. He denies any nausea, vomiting, constipation, or diarrhea. He has no further urinary complaints. Patient offers no specific complaints today.  REVIEW OF SYSTEMS:   Review of Systems  Constitutional: Negative.  Negative for fever, malaise/fatigue and weight loss.  Respiratory: Negative.  Negative for shortness of breath.   Cardiovascular: Negative.  Negative for chest pain and leg swelling.  Gastrointestinal: Negative.  Negative for abdominal pain.  Genitourinary: Positive for hematuria.  Musculoskeletal: Negative.   Skin: Negative.  Negative for rash.  Neurological: Negative.  Negative for weakness.  Psychiatric/Behavioral: Negative.  The patient is not nervous/anxious.     As per HPI. Otherwise, a complete review of systems is negative.  PAST MEDICAL HISTORY: Past Medical History:  Diagnosis Date  . Allergy   . Coronary artery disease   . Depression   . Hypertension   . Myocardial infarction ([place])   . Prostate cancer ([place])     PAST SURGICAL HISTORY: Past Surgical History:  Procedure Laterality Date  . BLADDER SURGERY    . CARDIAC SURGERY    . PORTA CATH INSERTION N/A 09/22/2016   Procedure: Glori Luis Cath Insertion;  Surgeon: Algernon Huxley, MD;  Location: [facility];   Service: Cardiovascular;  Laterality: N/A;    FAMILY HISTORY: Family History  Problem Relation Age of Onset  . Thyroid disease Mother   . Heart attack Father   . Heart disease Brother   . Parkinson's disease Brother     ADVANCED DIRECTIVES (Y/N):  N  HEALTH MAINTENANCE: Social History  Substance Use Topics  . Smoking status: Current Every Day Smoker    Packs/day: 0.50    Years: 50.00  . Smokeless tobacco: Never Used  . Alcohol use Yes     Comment: very rare     Colonoscopy:  PAP:  Bone density:  Lipid panel:  Allergies  Allergen Reactions  . Penicillin G Other (See Comments)    Current Outpatient Prescriptions  Medication Sig Dispense Refill  . acetaminophen (TYLENOL) 325 MG tablet Take 650 mg by mouth 2 (two) times daily as needed for mild pain or moderate pain.    Marland Kitchen albuterol (PROVENTIL HFA;VENTOLIN HFA) 108 (90 Base) MCG/ACT inhaler Inhale 2 puffs into the lungs every 4 (four) hours as needed for wheezing or shortness of breath.    Marland Kitchen ammonium lactate (LAC-HYDRIN) 12 % lotion Apply 1 application topically daily as needed for dry skin.    Marland Kitchen aspirin EC 81 MG tablet Take 81 mg by mouth daily.     Marland Kitchen atorvastatin (LIPITOR) 40 MG tablet Take 20 mg by mouth at bedtime.     . carvedilol (COREG) 25 MG tablet Take 25 mg by mouth 2 (two) times daily with a meal.     . cetirizine (ZYRTEC) 10 MG tablet Take 10 mg by mouth daily.    Marland Kitchen  diclofenac sodium (VOLTAREN) 1 % GEL Apply 1 application topically daily as needed.    . DiphenhydrAMINE HCl (ZZZQUIL) 50 MG/30ML LIQD Take 15 mLs by mouth at bedtime as needed.    . fluticasone (FLONASE) 50 MCG/ACT nasal spray Place 2 sprays into both nostrils daily as needed for allergies or rhinitis.    . polyvinyl alcohol (LIQUIFILM TEARS) 1.4 % ophthalmic solution Place 1 drop into both eyes daily as needed for dry eyes.    . promethazine (PHENERGAN) 50 MG tablet Take 50 mg by mouth every 6 (six) hours as needed for nausea or vomiting.    .  sertraline (ZOLOFT) 25 MG tablet Take 25 mg by mouth at bedtime.    . tamsulosin (FLOMAX) 0.4 MG CAPS capsule Take 0.4 mg by mouth 2 (two) times daily.    . Tiotropium Bromide-Olodaterol (STIOLTO RESPIMAT) 2.5-2.5 MCG/ACT AERS Inhale 2 puffs into the lungs every morning.    . traMADol (ULTRAM) 50 MG tablet Take 50 mg by mouth daily as needed for moderate pain.    . vitamin B-12 (CYANOCOBALAMIN) 1000 MCG tablet Take 1,000 mcg by mouth daily.    Marland Kitchen zolpidem (AMBIEN) 5 MG tablet Take 2.5 mg by mouth at bedtime as needed for sleep (VERY RARELY TAKES).     No current facility-administered medications for this visit.     OBJECTIVE: Vitals:   10/06/16 1109  BP: 130/87  Pulse: 81  Resp: 20  Temp: 97.5 F (36.4 C)     Body mass index is 28.85 kg/m.    ECOG FS:0 - Asymptomatic  General: Well-developed, well-nourished, no acute distress. Eyes: Pink conjunctiva, anicteric sclera. Lungs: Clear to auscultation bilaterally. Heart: Regular rate and rhythm. No rubs, murmurs, or gallops. Abdomen: Soft, nontender, nondistended. No organomegaly noted, normoactive bowel sounds. Musculoskeletal: No edema, cyanosis, or clubbing. Neuro: Alert, answering all questions appropriately. Cranial nerves grossly intact. Skin: No rashes or petechiae noted. Psych: Normal affect.   LAB RESULTS:  Lab Results  Component Value Date   NA 137 10/06/2016   K 3.5 10/06/2016   CL 102 10/06/2016   CO2 30 10/06/2016   GLUCOSE 132 (H) 10/06/2016   BUN 22 (H) 10/06/2016   CREATININE 0.86 10/06/2016   CALCIUM 8.9 10/06/2016   PROT 6.3 (L) 10/06/2016   ALBUMIN 3.3 (L) 10/06/2016   AST 19 10/06/2016   ALT 30 10/06/2016   ALKPHOS 62 10/06/2016   BILITOT 0.7 10/06/2016   GFRNONAA >60 10/06/2016   GFRAA >60 10/06/2016    Lab Results  Component Value Date   WBC 4.7 10/06/2016   NEUTROABS 3.6 10/06/2016   HGB 13.3 10/06/2016   HCT 39.4 (L) 10/06/2016   MCV 85.3 10/06/2016   PLT 79 (L) 10/06/2016      STUDIES: No results found.  ASSESSMENT: Stage IIIA high-grade urothelial carcinoma.  PLAN:    1. Stage IIIA high-grade urothelial carcinoma: Outside CT scans and biopsy results reviewed independently confirming diagnosis and stage. PET scan completed at the [facility] and we are trying to obtain these results. We will obtain the slides from his biopsy and imaging results. Agree with outside recommendation of neoadjuvant chemotherapy using cisplatin and gemcitabine for 3 cycles followed by complete cystectomy. Currently, patient is scheduled for surgery at the [facility] but has expressed desire for local urologist and a referral has been made. Patient will receive cisplatin and gemcitabine on day 1 followed by gemcitabine only on days 8 and day 15. This will be a 28 day cycle. Delay with  cycle 1, day 8 today secondary to thrombocytopenia. Return to clinic in 1 week for reconsideration of cycle 1, day 8 which will be gemcitabine only. Will dose reduced gemcitabine with next infusion. 2. Thrombocytopenia: Secondary chemotherapy, delay treatment as above.  Approximately 30 minutes was spent in discussion of which greater than 50% was consultation.  Patient expressed understanding and was in agreement with this plan. He also understands that He can call clinic at any time with any questions, concerns, or complaints.   Cancer Staging Bladder cancer [facility]) Staging form: Urinary Bladder, AJCC 8th Edition - Clinical stage from 09/04/2016: Stage IIIA (cT4a, cN0, cM0) - Signed by [name], MD on 09/04/2016   [name], MD   10/08/2016 2:45 PM

## 2016-10-06 ENCOUNTER — Inpatient Hospital Stay: Payer: Non-veteran care

## 2016-10-06 ENCOUNTER — Inpatient Hospital Stay (HOSPITAL_BASED_OUTPATIENT_CLINIC_OR_DEPARTMENT_OTHER): Payer: Non-veteran care | Admitting: Oncology

## 2016-10-06 VITALS — BP 130/87 | HR 81 | Temp 97.5°F | Resp 20 | Wt 212.7 lb

## 2016-10-06 DIAGNOSIS — Z8546 Personal history of malignant neoplasm of prostate: Secondary | ICD-10-CM

## 2016-10-06 DIAGNOSIS — Z95828 Presence of other vascular implants and grafts: Secondary | ICD-10-CM

## 2016-10-06 DIAGNOSIS — Z79899 Other long term (current) drug therapy: Secondary | ICD-10-CM | POA: Diagnosis not present

## 2016-10-06 DIAGNOSIS — I2510 Atherosclerotic heart disease of native coronary artery without angina pectoris: Secondary | ICD-10-CM | POA: Diagnosis not present

## 2016-10-06 DIAGNOSIS — R319 Hematuria, unspecified: Secondary | ICD-10-CM

## 2016-10-06 DIAGNOSIS — I10 Essential (primary) hypertension: Secondary | ICD-10-CM

## 2016-10-06 DIAGNOSIS — F17210 Nicotine dependence, cigarettes, uncomplicated: Secondary | ICD-10-CM

## 2016-10-06 DIAGNOSIS — C674 Malignant neoplasm of posterior wall of bladder: Secondary | ICD-10-CM

## 2016-10-06 DIAGNOSIS — T451X5S Adverse effect of antineoplastic and immunosuppressive drugs, sequela: Secondary | ICD-10-CM

## 2016-10-06 DIAGNOSIS — Z5111 Encounter for antineoplastic chemotherapy: Secondary | ICD-10-CM | POA: Diagnosis not present

## 2016-10-06 DIAGNOSIS — Z880 Allergy status to penicillin: Secondary | ICD-10-CM

## 2016-10-06 DIAGNOSIS — C679 Malignant neoplasm of bladder, unspecified: Secondary | ICD-10-CM

## 2016-10-06 DIAGNOSIS — D6959 Other secondary thrombocytopenia: Secondary | ICD-10-CM

## 2016-10-06 DIAGNOSIS — I252 Old myocardial infarction: Secondary | ICD-10-CM

## 2016-10-06 DIAGNOSIS — Z7982 Long term (current) use of aspirin: Secondary | ICD-10-CM

## 2016-10-06 LAB — COMPREHENSIVE METABOLIC PANEL
ALT: 30 U/L (ref 17–63)
AST: 19 U/L (ref 15–41)
Albumin: 3.3 g/dL — ABNORMAL LOW (ref 3.5–5.0)
Alkaline Phosphatase: 62 U/L (ref 38–126)
Anion gap: 5 (ref 5–15)
BILIRUBIN TOTAL: 0.7 mg/dL (ref 0.3–1.2)
BUN: 22 mg/dL — AB (ref 6–20)
CO2: 30 mmol/L (ref 22–32)
CREATININE: 0.86 mg/dL (ref 0.61–1.24)
Calcium: 8.9 mg/dL (ref 8.9–10.3)
Chloride: 102 mmol/L (ref 101–111)
Glucose, Bld: 132 mg/dL — ABNORMAL HIGH (ref 65–99)
POTASSIUM: 3.5 mmol/L (ref 3.5–5.1)
Sodium: 137 mmol/L (ref 135–145)
TOTAL PROTEIN: 6.3 g/dL — AB (ref 6.5–8.1)

## 2016-10-06 LAB — CBC WITH DIFFERENTIAL/PLATELET
BASOS ABS: 0 10*3/uL (ref 0–0.1)
Basophils Relative: 1 %
EOS PCT: 1 %
Eosinophils Absolute: 0.1 10*3/uL (ref 0–0.7)
HEMATOCRIT: 39.4 % — AB (ref 40.0–52.0)
Hemoglobin: 13.3 g/dL (ref 13.0–18.0)
LYMPHS ABS: 0.8 10*3/uL — AB (ref 1.0–3.6)
LYMPHS PCT: 16 %
MCH: 28.9 pg (ref 26.0–34.0)
MCHC: 33.9 g/dL (ref 32.0–36.0)
MCV: 85.3 fL (ref 80.0–100.0)
MONO ABS: 0.2 10*3/uL (ref 0.2–1.0)
MONOS PCT: 5 %
NEUTROS ABS: 3.6 10*3/uL (ref 1.4–6.5)
Neutrophils Relative %: 77 %
Platelets: 79 10*3/uL — ABNORMAL LOW (ref 150–440)
RBC: 4.61 MIL/uL (ref 4.40–5.90)
RDW: 13.3 % (ref 11.5–14.5)
WBC: 4.7 10*3/uL (ref 3.8–10.6)

## 2016-10-06 MED ORDER — HEPARIN SOD (PORK) LOCK FLUSH 100 UNIT/ML IV SOLN
500.0000 [IU] | Freq: Once | INTRAVENOUS | Status: AC
  Administered 2016-10-06: 500 [IU] via INTRAVENOUS

## 2016-10-06 MED ORDER — HEPARIN SOD (PORK) LOCK FLUSH 100 UNIT/ML IV SOLN
INTRAVENOUS | Status: AC
  Filled 2016-10-06: qty 5

## 2016-10-06 NOTE — Progress Notes (Signed)
Patient denies any concerns today.  

## 2016-10-12 NOTE — Progress Notes (Signed)
[place]  [phone]) [phone] Fax:(336) [phone]  ID: Gabriel Robbins OB: [birth year]  MR#: [id number]  NKN#:[id number]  Patient Care Team: [name], NP as PCP - General (Family Medicine)  CHIEF COMPLAINT: Stage IIIA high-grade urothelial carcinoma.  INTERVAL HISTORY: Patient returns to clinic today for further evaluation and reconsideration of cycle 1, day 8 of cisplatin and gemcitabine. He has increased weakness and fatigue, but otherwise feels well and is tolerating his treatments. He has no neurologic complaints. He denies any recent fevers or illnesses. He has a good appetite and denies weight loss. He has no chest pain or shortness of breath. He denies any nausea, vomiting, constipation, or diarrhea. He has no further urinary complaints. Patient offers no specific complaints today.  REVIEW OF SYSTEMS:   Review of Systems  Constitutional: Positive for malaise/fatigue. Negative for fever and weight loss.  Respiratory: Negative.  Negative for shortness of breath.   Cardiovascular: Negative.  Negative for chest pain and leg swelling.  Gastrointestinal: Negative.  Negative for abdominal pain.  Genitourinary: Positive for hematuria.  Musculoskeletal: Negative.   Skin: Negative.  Negative for rash.  Neurological: Positive for weakness.  Psychiatric/Behavioral: Negative.  The patient is not nervous/anxious.     As per HPI. Otherwise, a complete review of systems is negative.  PAST MEDICAL HISTORY: Past Medical History:  Diagnosis Date  . Allergy   . Coronary artery disease   . Depression   . Hypertension   . Myocardial infarction ([place])   . Prostate cancer ([place])     PAST SURGICAL HISTORY: Past Surgical History:  Procedure Laterality Date  . BLADDER SURGERY    . CARDIAC SURGERY    . PORTA CATH INSERTION N/A 09/22/2016   Procedure: Glori Luis Cath Insertion;  Surgeon: Algernon Huxley, MD;  Location: [facility];  Service: Cardiovascular;   Laterality: N/A;    FAMILY HISTORY: Family History  Problem Relation Age of Onset  . Thyroid disease Mother   . Heart attack Father   . Heart disease Brother   . Parkinson's disease Brother     ADVANCED DIRECTIVES (Y/N):  N  HEALTH MAINTENANCE: Social History  Substance Use Topics  . Smoking status: Current Every Day Smoker    Packs/day: 0.50    Years: 50.00  . Smokeless tobacco: Never Used  . Alcohol use Yes     Comment: very rare     Colonoscopy:  PAP:  Bone density:  Lipid panel:  Allergies  Allergen Reactions  . Penicillin G Other (See Comments)    Current Outpatient Prescriptions  Medication Sig Dispense Refill  . acetaminophen (TYLENOL) 325 MG tablet Take 650 mg by mouth 2 (two) times daily as needed for mild pain or moderate pain.    Marland Kitchen albuterol (PROVENTIL HFA;VENTOLIN HFA) 108 (90 Base) MCG/ACT inhaler Inhale 2 puffs into the lungs every 4 (four) hours as needed for wheezing or shortness of breath.    Marland Kitchen ammonium lactate (LAC-HYDRIN) 12 % lotion Apply 1 application topically daily as needed for dry skin.    Marland Kitchen aspirin EC 81 MG tablet Take 81 mg by mouth daily.     Marland Kitchen atorvastatin (LIPITOR) 40 MG tablet Take 20 mg by mouth at bedtime.     . carvedilol (COREG) 25 MG tablet Take 25 mg by mouth 2 (two) times daily with a meal.     . cetirizine (ZYRTEC) 10 MG tablet Take 10 mg by mouth daily.    . diclofenac sodium (VOLTAREN) 1 %  GEL Apply 1 application topically daily as needed.    . DiphenhydrAMINE HCl (ZZZQUIL) 50 MG/30ML LIQD Take 15 mLs by mouth at bedtime as needed.    . fluticasone (FLONASE) 50 MCG/ACT nasal spray Place 2 sprays into both nostrils daily as needed for allergies or rhinitis.    . polyvinyl alcohol (LIQUIFILM TEARS) 1.4 % ophthalmic solution Place 1 drop into both eyes daily as needed for dry eyes.    . promethazine (PHENERGAN) 50 MG tablet Take 50 mg by mouth every 6 (six) hours as needed for nausea or vomiting.    . sertraline (ZOLOFT) 25 MG  tablet Take 25 mg by mouth at bedtime.    . tamsulosin (FLOMAX) 0.4 MG CAPS capsule Take 0.4 mg by mouth 2 (two) times daily.    . Tiotropium Bromide-Olodaterol (STIOLTO RESPIMAT) 2.5-2.5 MCG/ACT AERS Inhale 2 puffs into the lungs every morning.    . traMADol (ULTRAM) 50 MG tablet Take 50 mg by mouth daily as needed for moderate pain.    . vitamin B-12 (CYANOCOBALAMIN) 1000 MCG tablet Take 1,000 mcg by mouth daily.    Marland Kitchen zolpidem (AMBIEN) 5 MG tablet Take 2.5 mg by mouth at bedtime as needed for sleep (VERY RARELY TAKES).     No current facility-administered medications for this visit.     OBJECTIVE: There were no vitals filed for this visit.   There is no height or weight on file to calculate BMI.    ECOG FS:0 - Asymptomatic  General: Well-developed, well-nourished, no acute distress. Eyes: Pink conjunctiva, anicteric sclera. Lungs: Clear to auscultation bilaterally. Heart: Regular rate and rhythm. No rubs, murmurs, or gallops. Abdomen: Soft, nontender, nondistended. No organomegaly noted, normoactive bowel sounds. Musculoskeletal: No edema, cyanosis, or clubbing. Neuro: Alert, answering all questions appropriately. Cranial nerves grossly intact. Skin: No rashes or petechiae noted. Psych: Normal affect.   LAB RESULTS:  Lab Results  Component Value Date   NA 137 10/06/2016   K 3.5 10/06/2016   CL 102 10/06/2016   CO2 30 10/06/2016   GLUCOSE 132 (H) 10/06/2016   BUN 22 (H) 10/06/2016   CREATININE 0.86 10/06/2016   CALCIUM 8.9 10/06/2016   PROT 6.3 (L) 10/06/2016   ALBUMIN 3.3 (L) 10/06/2016   AST 19 10/06/2016   ALT 30 10/06/2016   ALKPHOS 62 10/06/2016   BILITOT 0.7 10/06/2016   GFRNONAA >60 10/06/2016   GFRAA >60 10/06/2016    Lab Results  Component Value Date   WBC 4.7 10/06/2016   NEUTROABS 3.6 10/06/2016   HGB 13.3 10/06/2016   HCT 39.4 (L) 10/06/2016   MCV 85.3 10/06/2016   PLT 79 (L) 10/06/2016     STUDIES: No results found.  ASSESSMENT: Stage IIIA  high-grade urothelial carcinoma.  PLAN:    1. Stage IIIA high-grade urothelial carcinoma: Outside CT scans and biopsy results reviewed independently confirming diagnosis and stage. [facility] scan completed at the [facility] and we are trying to obtain these results. We will obtain the slides from his biopsy and imaging results. Agree with outside recommendation of neoadjuvant chemotherapy using cisplatin and gemcitabine for 3 cycles followed by complete cystectomy. Currently, patient is scheduled for surgery at the [facility] but has expressed desire for local urologist and a referral has been made. Patient will receive cisplatin and gemcitabine on day 1 followed by gemcitabine only on days 8 and day 15. This will be a 28 day cycle. Proceed with cycle 1, day 8 today. Return to clinic in 1 week for consideration of  cycle 1, day 15 which will be gemcitabine only. Gemcitabine has been dose reduced 20% secondary to thrombocytopenia. 2. Thrombocytopenia: Resolved. Dose reduce gemcitabine as above. 3. Cystectomy: Although patient has expressed a desire to have his surgery locally, given his underlying insurance he may have to proceed with surgery at the [place] as originally planned.  Approximately 30 minutes was spent in discussion of which greater than 50% was consultation.  Patient expressed understanding and was in agreement with this plan. He also understands that He can call clinic at any time with any questions, concerns, or complaints.   Cancer Staging Bladder cancer [facility]) Staging form: Urinary Bladder, AJCC 8th Edition - Clinical stage from 09/04/2016: Stage IIIA (cT4a, cN0, cM0) - Signed by [name], MD on 09/04/2016   [name], MD   10/12/2016 11:45 PM

## 2016-10-13 ENCOUNTER — Inpatient Hospital Stay: Payer: Non-veteran care

## 2016-10-13 ENCOUNTER — Inpatient Hospital Stay (HOSPITAL_BASED_OUTPATIENT_CLINIC_OR_DEPARTMENT_OTHER): Payer: Non-veteran care | Admitting: Oncology

## 2016-10-13 VITALS — BP 124/82 | HR 78 | Temp 97.5°F | Resp 20 | Wt 215.4 lb

## 2016-10-13 DIAGNOSIS — C674 Malignant neoplasm of posterior wall of bladder: Secondary | ICD-10-CM

## 2016-10-13 DIAGNOSIS — C679 Malignant neoplasm of bladder, unspecified: Secondary | ICD-10-CM | POA: Diagnosis not present

## 2016-10-13 DIAGNOSIS — T451X5S Adverse effect of antineoplastic and immunosuppressive drugs, sequela: Secondary | ICD-10-CM

## 2016-10-13 DIAGNOSIS — F17210 Nicotine dependence, cigarettes, uncomplicated: Secondary | ICD-10-CM

## 2016-10-13 DIAGNOSIS — Z8546 Personal history of malignant neoplasm of prostate: Secondary | ICD-10-CM

## 2016-10-13 DIAGNOSIS — R319 Hematuria, unspecified: Secondary | ICD-10-CM | POA: Diagnosis not present

## 2016-10-13 DIAGNOSIS — I10 Essential (primary) hypertension: Secondary | ICD-10-CM | POA: Diagnosis not present

## 2016-10-13 DIAGNOSIS — Z7982 Long term (current) use of aspirin: Secondary | ICD-10-CM

## 2016-10-13 DIAGNOSIS — Z880 Allergy status to penicillin: Secondary | ICD-10-CM

## 2016-10-13 DIAGNOSIS — Z79899 Other long term (current) drug therapy: Secondary | ICD-10-CM

## 2016-10-13 DIAGNOSIS — I2510 Atherosclerotic heart disease of native coronary artery without angina pectoris: Secondary | ICD-10-CM | POA: Diagnosis not present

## 2016-10-13 DIAGNOSIS — F329 Major depressive disorder, single episode, unspecified: Secondary | ICD-10-CM

## 2016-10-13 DIAGNOSIS — D6959 Other secondary thrombocytopenia: Secondary | ICD-10-CM | POA: Diagnosis not present

## 2016-10-13 DIAGNOSIS — R5383 Other fatigue: Secondary | ICD-10-CM

## 2016-10-13 DIAGNOSIS — I252 Old myocardial infarction: Secondary | ICD-10-CM

## 2016-10-13 DIAGNOSIS — R531 Weakness: Secondary | ICD-10-CM

## 2016-10-13 DIAGNOSIS — Z5111 Encounter for antineoplastic chemotherapy: Secondary | ICD-10-CM | POA: Diagnosis not present

## 2016-10-13 LAB — COMPREHENSIVE METABOLIC PANEL
ALK PHOS: 61 U/L (ref 38–126)
ALT: 16 U/L — ABNORMAL LOW (ref 17–63)
ANION GAP: 8 (ref 5–15)
AST: 19 U/L (ref 15–41)
Albumin: 3.3 g/dL — ABNORMAL LOW (ref 3.5–5.0)
BUN: 18 mg/dL (ref 6–20)
CALCIUM: 8.5 mg/dL — AB (ref 8.9–10.3)
CO2: 24 mmol/L (ref 22–32)
Chloride: 104 mmol/L (ref 101–111)
Creatinine, Ser: 0.85 mg/dL (ref 0.61–1.24)
GFR calc Af Amer: >60 mL/min (ref >60)
Glucose, Bld: 103 mg/dL — ABNORMAL HIGH (ref 65–99)
Potassium: 3.6 mmol/L (ref 3.5–5.1)
SODIUM: 136 mmol/L (ref 135–145)
Total Bilirubin: 1.3 mg/dL — ABNORMAL HIGH (ref 0.3–1.2)
Total Protein: 6.7 g/dL (ref 6.5–8.1)

## 2016-10-13 LAB — CBC WITH DIFFERENTIAL/PLATELET
Basophils Absolute: 0 10*3/uL (ref 0–0.1)
Basophils Relative: 0 %
EOS ABS: 0.1 10*3/uL (ref 0–0.7)
EOS PCT: 1 %
HCT: 37.1 % — ABNORMAL LOW (ref 40.0–52.0)
HEMOGLOBIN: 12.8 g/dL — AB (ref 13.0–18.0)
LYMPHS ABS: 0.8 10*3/uL — AB (ref 1.0–3.6)
LYMPHS PCT: 13 %
MCH: 29.2 pg (ref 26.0–34.0)
MCHC: 34.6 g/dL (ref 32.0–36.0)
MCV: 84.6 fL (ref 80.0–100.0)
MONOS PCT: 9 %
Monocytes Absolute: 0.6 10*3/uL (ref 0.2–1.0)
Neutro Abs: 5 10*3/uL (ref 1.4–6.5)
Neutrophils Relative %: 77 %
PLATELETS: 209 10*3/uL (ref 150–440)
RBC: 4.38 MIL/uL — ABNORMAL LOW (ref 4.40–5.90)
RDW: 13.8 % (ref 11.5–14.5)
WBC: 6.5 10*3/uL (ref 3.8–10.6)

## 2016-10-13 MED ORDER — SODIUM CHLORIDE 0.9 % IV SOLN
Freq: Once | INTRAVENOUS | Status: AC
  Administered 2016-10-13: 11:00:00 via INTRAVENOUS
  Filled 2016-10-13: qty 1000

## 2016-10-13 MED ORDER — SODIUM CHLORIDE 0.9 % IV SOLN
1800.0000 mg | Freq: Once | INTRAVENOUS | Status: AC
  Administered 2016-10-13: 1800 mg via INTRAVENOUS
  Filled 2016-10-13: qty 26.3

## 2016-10-13 MED ORDER — PROCHLORPERAZINE MALEATE 10 MG PO TABS
10.0000 mg | ORAL_TABLET | Freq: Once | ORAL | Status: AC
  Administered 2016-10-13: 10 mg via ORAL
  Filled 2016-10-13: qty 1

## 2016-10-13 MED ORDER — HEPARIN SOD (PORK) LOCK FLUSH 100 UNIT/ML IV SOLN
500.0000 [IU] | Freq: Once | INTRAVENOUS | Status: AC | PRN
  Administered 2016-10-13: 500 [IU]
  Filled 2016-10-13: qty 5

## 2016-10-13 NOTE — Progress Notes (Signed)
Patient denies any concerns today.  

## 2016-10-19 NOTE — Progress Notes (Signed)
[place]  [phone] [phone]) [phone]  ID: Gabriel Robbins OB: 1946/02/18  MR#: [id number]  PRF#:[id number]  Patient Care Team: [name], NP as PCP - General (Family Medicine)  CHIEF COMPLAINT: Stage IIIA high-grade urothelial carcinoma.  INTERVAL HISTORY: Patient returns to clinic today for further evaluation and consideration of cycle 1, day 15 of cisplatin and gemcitabine. Gemcitabine only today. He has increased weakness and fatigue, and is tolerating his treatments. He has no neurologic complaints. He admits to allergies, runny nose, and a cough that is worse at night. His cough has been keeping him up at night and causing his throat to be sore. He was prescribed Flonase by the [place]. No fevers. He has a good appetite and denies weight loss. He has no chest pain but has mild shortness of breath. He denies any nausea, vomiting, constipation, or diarrhea. He has no further urinary complaints. Patient offers no further specific complaints today.  REVIEW OF SYSTEMS:   Review of Systems  Constitutional: Positive for malaise/fatigue. Negative for fever and weight loss.  HENT: Negative.   Eyes: Negative.   Respiratory: Positive for cough and shortness of breath.        Due to COPD. Also has a cough and allergies.   Cardiovascular: Negative.  Negative for chest pain and leg swelling.  Gastrointestinal: Negative.  Negative for abdominal pain.  Genitourinary: Negative.  Negative for hematuria.  Musculoskeletal: Negative.   Skin: Negative.  Negative for rash.  Neurological: Positive for weakness.  Psychiatric/Behavioral: Negative.  The patient is not nervous/anxious.     As per HPI. Otherwise, a complete review of systems is negative.  PAST MEDICAL HISTORY: Past Medical History:  Diagnosis Date  . Allergy   . Coronary artery disease   . Depression   . Hypertension   . Myocardial infarction ([place])   . Prostate cancer ([place])     PAST  SURGICAL HISTORY: Past Surgical History:  Procedure Laterality Date  . BLADDER SURGERY    . CARDIAC SURGERY    . PORTA CATH INSERTION N/A 09/22/2016   Procedure: Glori Luis Cath Insertion;  Surgeon: Algernon Huxley, MD;  Location: [facility];  Service: Cardiovascular;  Laterality: N/A;    FAMILY HISTORY: Family History  Problem Relation Age of Onset  . Thyroid disease Mother   . Heart attack Father   . Heart disease Brother   . Parkinson's disease Brother     ADVANCED DIRECTIVES (Y/N):  N  HEALTH MAINTENANCE: Social History  Substance Use Topics  . Smoking status: Current Every Day Smoker    Packs/day: 0.50    Years: 50.00  . Smokeless tobacco: Never Used  . Alcohol use Yes     Comment: very rare     Colonoscopy:  PAP:  Bone density:  Lipid panel:  Allergies  Allergen Reactions  . Penicillin G Other (See Comments)    Current Outpatient Prescriptions  Medication Sig Dispense Refill  . acetaminophen (TYLENOL) 325 MG tablet Take 650 mg by mouth 2 (two) times daily as needed for mild pain or moderate pain.    Marland Kitchen albuterol (PROVENTIL HFA;VENTOLIN HFA) 108 (90 Base) MCG/ACT inhaler Inhale 2 puffs into the lungs every 4 (four) hours as needed for wheezing or shortness of breath.    Marland Kitchen ammonium lactate (LAC-HYDRIN) 12 % lotion Apply 1 application topically daily as needed for dry skin.    Marland Kitchen aspirin EC 81 MG tablet Take 81 mg by mouth daily.     Marland Kitchen  atorvastatin (LIPITOR) 40 MG tablet Take 20 mg by mouth at bedtime.     . carvedilol (COREG) 25 MG tablet Take 25 mg by mouth 2 (two) times daily with a meal.     . cetirizine (ZYRTEC) 10 MG tablet Take 10 mg by mouth daily.    . diclofenac sodium (VOLTAREN) 1 % GEL Apply 1 application topically daily as needed.    . DiphenhydrAMINE HCl (ZZZQUIL) 50 MG/30ML LIQD Take 15 mLs by mouth at bedtime as needed.    . fluticasone (FLONASE) 50 MCG/ACT nasal spray Place 2 sprays into both nostrils daily as needed for allergies or rhinitis.      . polyvinyl alcohol (LIQUIFILM TEARS) 1.4 % ophthalmic solution Place 1 drop into both eyes daily as needed for dry eyes.    . promethazine (PHENERGAN) 50 MG tablet Take 50 mg by mouth every 6 (six) hours as needed for nausea or vomiting.    . sertraline (ZOLOFT) 25 MG tablet Take 25 mg by mouth at bedtime.    . tamsulosin (FLOMAX) 0.4 MG CAPS capsule Take 0.4 mg by mouth 2 (two) times daily.    . Tiotropium Bromide-Olodaterol (STIOLTO RESPIMAT) 2.5-2.5 MCG/ACT AERS Inhale 2 puffs into the lungs every morning.    . traMADol (ULTRAM) 50 MG tablet Take 50 mg by mouth daily as needed for moderate pain.    . vitamin B-12 (CYANOCOBALAMIN) 1000 MCG tablet Take 1,000 mcg by mouth daily.    Marland Kitchen zolpidem (AMBIEN) 5 MG tablet Take 2.5 mg by mouth at bedtime as needed for sleep (VERY RARELY TAKES).    . chlorpheniramine-HYDROcodone (TUSSIONEX PENNKINETIC ER) 10-8 MG/5ML SUER Take 5 mLs by mouth every 12 (twelve) hours as needed for cough. 140 mL 0   No current facility-administered medications for this visit.     OBJECTIVE: Vitals:   10/20/16 1102  BP: 130/90  Pulse: 73  Resp: 20  Temp: 98.1 F (36.7 C)     Body mass index is 29.55 kg/m.    ECOG FS:0 - Asymptomatic  General: Well-developed, well-nourished, no acute distress. Eyes: Pink conjunctiva, anicteric sclera. Lungs: Clear to auscultation bilaterally. Heart: Regular rate and rhythm. No rubs, murmurs, or gallops. Abdomen: Soft, nontender, nondistended. No organomegaly noted, normoactive bowel sounds. Musculoskeletal: No edema, cyanosis, or clubbing. Neuro: Alert, answering all questions appropriately. Cranial nerves grossly intact. Skin: No rashes or petechiae noted. Psych: Normal affect.   LAB RESULTS:  Lab Results  Component Value Date   NA 135 10/20/2016   K 3.8 10/20/2016   CL 104 10/20/2016   CO2 25 10/20/2016   GLUCOSE 113 (H) 10/20/2016   BUN 15 10/20/2016   CREATININE 0.98 10/20/2016   CALCIUM 8.4 (L) 10/20/2016    PROT 6.2 (L) 10/20/2016   ALBUMIN 3.1 (L) 10/20/2016   AST 18 10/20/2016   ALT 12 (L) 10/20/2016   ALKPHOS 61 10/20/2016   BILITOT 0.4 10/20/2016   GFRNONAA >60 10/20/2016   GFRAA >60 10/20/2016    Lab Results  Component Value Date   WBC 3.8 10/20/2016   NEUTROABS 2.0 10/20/2016   HGB 12.0 (L) 10/20/2016   HCT 35.3 (L) 10/20/2016   MCV 85.2 10/20/2016   PLT 209 10/20/2016     STUDIES: No results found.  ASSESSMENT: Stage IIIA high-grade urothelial carcinoma.  PLAN:    1. Stage IIIA high-grade urothelial carcinoma: Recommendation of neoadjuvant chemotherapy using cisplatin and gemcitabine for 3 cycles followed by complete cystectomy. Currently, patient is scheduled for surgery at the [facility]  but has expressed desire for local urologist but unfortunately the [facility] will not pay for it to be done local. Patient aware. Patient will receive cisplatin and gemcitabine on day 1 followed by gemcitabine only on days 8 and day 15. This will be a 28 day cycle. Proceed with cycle 1, day 15 today. Return to clinic in 2 week for consideration of cycle 2, day 1. Gemcitabine has been dose reduced 20% secondary to thrombocytopenia. 2. Thrombocytopenia: Resolved. Dose reduce gemcitabine as above. Today Plt are 209.  3. Cystectomy: Although patient has expressed a desire to have his surgery locally, given his underlying insurance he will have to proceed with surgery at the [place] as originally planned. 4. Cough: Keeping him up at night. RX tussionex 5 ml q 12 hours PRN.   Approximately 30 minutes was spent in discussion of which greater than 50% was consultation.  Patient expressed understanding and was in agreement with this plan. He also understands that He can call clinic at any time with any questions, concerns, or complaints.   Cancer Staging Bladder cancer [facility]) Staging form: Urinary Bladder, AJCC 8th Edition - Clinical stage from 09/04/2016: Stage IIIA (cT4a, cN0, cM0) - Signed by [name],  MD on 09/04/2016  Faythe Casa, NP  Patient was seen and evaluated independently and I agree with the assessment and plan as dictated above.  [name], MD 10/23/16 10:07 PM

## 2016-10-20 ENCOUNTER — Inpatient Hospital Stay (HOSPITAL_BASED_OUTPATIENT_CLINIC_OR_DEPARTMENT_OTHER): Payer: Non-veteran care | Admitting: Oncology

## 2016-10-20 ENCOUNTER — Inpatient Hospital Stay: Payer: Non-veteran care

## 2016-10-20 VITALS — BP 130/90 | HR 73 | Temp 98.1°F | Resp 20 | Wt 217.9 lb

## 2016-10-20 DIAGNOSIS — I10 Essential (primary) hypertension: Secondary | ICD-10-CM | POA: Diagnosis not present

## 2016-10-20 DIAGNOSIS — F329 Major depressive disorder, single episode, unspecified: Secondary | ICD-10-CM

## 2016-10-20 DIAGNOSIS — C674 Malignant neoplasm of posterior wall of bladder: Secondary | ICD-10-CM

## 2016-10-20 DIAGNOSIS — J309 Allergic rhinitis, unspecified: Secondary | ICD-10-CM

## 2016-10-20 DIAGNOSIS — R5381 Other malaise: Secondary | ICD-10-CM | POA: Diagnosis not present

## 2016-10-20 DIAGNOSIS — R531 Weakness: Secondary | ICD-10-CM

## 2016-10-20 DIAGNOSIS — I2510 Atherosclerotic heart disease of native coronary artery without angina pectoris: Secondary | ICD-10-CM

## 2016-10-20 DIAGNOSIS — R5383 Other fatigue: Secondary | ICD-10-CM | POA: Diagnosis not present

## 2016-10-20 DIAGNOSIS — Z7982 Long term (current) use of aspirin: Secondary | ICD-10-CM

## 2016-10-20 DIAGNOSIS — Z880 Allergy status to penicillin: Secondary | ICD-10-CM

## 2016-10-20 DIAGNOSIS — I252 Old myocardial infarction: Secondary | ICD-10-CM

## 2016-10-20 DIAGNOSIS — C679 Malignant neoplasm of bladder, unspecified: Secondary | ICD-10-CM | POA: Diagnosis not present

## 2016-10-20 DIAGNOSIS — J449 Chronic obstructive pulmonary disease, unspecified: Secondary | ICD-10-CM

## 2016-10-20 DIAGNOSIS — F17210 Nicotine dependence, cigarettes, uncomplicated: Secondary | ICD-10-CM

## 2016-10-20 DIAGNOSIS — Z5111 Encounter for antineoplastic chemotherapy: Secondary | ICD-10-CM | POA: Diagnosis not present

## 2016-10-20 DIAGNOSIS — Z79899 Other long term (current) drug therapy: Secondary | ICD-10-CM

## 2016-10-20 DIAGNOSIS — Z8546 Personal history of malignant neoplasm of prostate: Secondary | ICD-10-CM

## 2016-10-20 LAB — COMPREHENSIVE METABOLIC PANEL
ALT: 12 U/L — AB (ref 17–63)
AST: 18 U/L (ref 15–41)
Albumin: 3.1 g/dL — ABNORMAL LOW (ref 3.5–5.0)
Alkaline Phosphatase: 61 U/L (ref 38–126)
Anion gap: 6 (ref 5–15)
BUN: 15 mg/dL (ref 6–20)
CHLORIDE: 104 mmol/L (ref 101–111)
CO2: 25 mmol/L (ref 22–32)
Calcium: 8.4 mg/dL — ABNORMAL LOW (ref 8.9–10.3)
Creatinine, Ser: 0.98 mg/dL (ref 0.61–1.24)
GFR calc Af Amer: >60 mL/min (ref >60)
GFR calc non Af Amer: >60 mL/min (ref >60)
Glucose, Bld: 113 mg/dL — ABNORMAL HIGH (ref 65–99)
Potassium: 3.8 mmol/L (ref 3.5–5.1)
SODIUM: 135 mmol/L (ref 135–145)
Total Bilirubin: 0.4 mg/dL (ref 0.3–1.2)
Total Protein: 6.2 g/dL — ABNORMAL LOW (ref 6.5–8.1)

## 2016-10-20 LAB — CBC WITH DIFFERENTIAL/PLATELET
BASOS ABS: 0 10*3/uL (ref 0–0.1)
Basophils Relative: 1 %
EOS ABS: 0 10*3/uL (ref 0–0.7)
Eosinophils Relative: 0 %
HCT: 35.3 % — ABNORMAL LOW (ref 40.0–52.0)
HEMOGLOBIN: 12 g/dL — AB (ref 13.0–18.0)
LYMPHS PCT: 31 %
Lymphs Abs: 1.2 10*3/uL (ref 1.0–3.6)
MCH: 28.8 pg (ref 26.0–34.0)
MCHC: 33.8 g/dL (ref 32.0–36.0)
MCV: 85.2 fL (ref 80.0–100.0)
Monocytes Absolute: 0.6 10*3/uL (ref 0.2–1.0)
Monocytes Relative: 16 %
NEUTROS PCT: 52 %
Neutro Abs: 2 10*3/uL (ref 1.4–6.5)
PLATELETS: 209 10*3/uL (ref 150–440)
RBC: 4.15 MIL/uL — AB (ref 4.40–5.90)
RDW: 13.3 % (ref 11.5–14.5)
WBC: 3.8 10*3/uL (ref 3.8–10.6)

## 2016-10-20 MED ORDER — SODIUM CHLORIDE 0.9% FLUSH
10.0000 mL | INTRAVENOUS | Status: DC | PRN
  Administered 2016-10-20: 10 mL via INTRAVENOUS
  Filled 2016-10-20: qty 10

## 2016-10-20 MED ORDER — HYDROCOD POLST-CPM POLST ER 10-8 MG/5ML PO SUER: 5.0000 mL | Freq: Two times a day (BID) | ORAL | 0 refills | Status: DC | PRN

## 2016-10-20 MED ORDER — PROCHLORPERAZINE MALEATE 10 MG PO TABS
10.0000 mg | ORAL_TABLET | Freq: Once | ORAL | Status: AC
  Administered 2016-10-20: 10 mg via ORAL
  Filled 2016-10-20: qty 1

## 2016-10-20 MED ORDER — SODIUM CHLORIDE 0.9 % IV SOLN
1800.0000 mg | Freq: Once | INTRAVENOUS | Status: AC
  Administered 2016-10-20: 1800 mg via INTRAVENOUS
  Filled 2016-10-20: qty 26.3

## 2016-10-20 MED ORDER — SODIUM CHLORIDE 0.9 % IV SOLN
Freq: Once | INTRAVENOUS | Status: AC
  Administered 2016-10-20: 12:00:00 via INTRAVENOUS
  Filled 2016-10-20: qty 1000

## 2016-10-20 MED ORDER — HEPARIN SOD (PORK) LOCK FLUSH 100 UNIT/ML IV SOLN
INTRAVENOUS | Status: AC
  Filled 2016-10-20: qty 5

## 2016-10-20 MED ORDER — HEPARIN SOD (PORK) LOCK FLUSH 100 UNIT/ML IV SOLN
500.0000 [IU] | Freq: Once | INTRAVENOUS | Status: AC
  Administered 2016-10-20: 500 [IU] via INTRAVENOUS

## 2016-10-20 NOTE — Progress Notes (Signed)
Patient reports productive cough today.

## 2016-11-03 ENCOUNTER — Ambulatory Visit: Payer: Non-veteran care | Admitting: Oncology

## 2016-11-03 ENCOUNTER — Ambulatory Visit: Payer: Non-veteran care

## 2016-11-03 ENCOUNTER — Other Ambulatory Visit: Payer: Non-veteran care

## 2016-11-08 ENCOUNTER — Ambulatory Visit: Payer: Self-pay

## 2016-11-09 NOTE — Progress Notes (Signed)
[place]  Telephone:(336) [phone] [phone]) [phone]  ID: Harbert Fitterer OB: 08-31-1945  MR#: [id number]  MGQ#:[id number]  Patient Care Team: [name], NP as PCP - General (Family Medicine)  CHIEF COMPLAINT: Stage IIIA high-grade urothelial carcinoma.  INTERVAL HISTORY: Patient returns to clinic today for further evaluation and consideration of cycle 2, day 1 of cisplatin and gemcitabine. He has increased weakness and fatigue, but otherwise feels well and is tolerating his treatments. He has no neurologic complaints. He denies any recent fevers or illnesses. He has a good appetite and denies weight loss. He has no chest pain or shortness of breath. He denies any nausea, vomiting, constipation, or diarrhea. He has no further urinary complaints. Patient offers no further specific complaints today.  REVIEW OF SYSTEMS:   Review of Systems  Constitutional: Positive for malaise/fatigue. Negative for fever and weight loss.  Respiratory: Negative.  Negative for shortness of breath.   Cardiovascular: Negative.  Negative for chest pain and leg swelling.  Gastrointestinal: Negative.  Negative for abdominal pain.  Genitourinary: Negative.  Negative for hematuria.  Musculoskeletal: Negative.   Skin: Negative.  Negative for rash.  Neurological: Positive for weakness.  Psychiatric/Behavioral: Negative.  The patient is not nervous/anxious.     As per HPI. Otherwise, a complete review of systems is negative.  PAST MEDICAL HISTORY: Past Medical History:  Diagnosis Date  . Allergy   . Coronary artery disease   . Depression   . Hypertension   . Myocardial infarction ([place])   . Prostate cancer ([place])     PAST SURGICAL HISTORY: Past Surgical History:  Procedure Laterality Date  . BLADDER SURGERY    . CARDIAC SURGERY    . PORTA CATH INSERTION N/A 09/22/2016   Procedure: Glori Luis Cath Insertion;  Surgeon: Algernon Huxley, MD;  Location: [facility];  Service:  Cardiovascular;  Laterality: N/A;    FAMILY HISTORY: Family History  Problem Relation Age of Onset  . Thyroid disease Mother   . Heart attack Father   . Heart disease Brother   . Parkinson's disease Brother     ADVANCED DIRECTIVES (Y/N):  N  HEALTH MAINTENANCE: Social History  Substance Use Topics  . Smoking status: Current Every Day Smoker    Packs/day: 0.50    Years: 50.00  . Smokeless tobacco: Never Used  . Alcohol use Yes     Comment: very rare     Colonoscopy:  PAP:  Bone density:  Lipid panel:  Allergies  Allergen Reactions  . Penicillin G Other (See Comments)    Current Outpatient Prescriptions  Medication Sig Dispense Refill  . acetaminophen (TYLENOL) 325 MG tablet Take 650 mg by mouth 2 (two) times daily as needed for mild pain or moderate pain.    Marland Kitchen albuterol (PROVENTIL HFA;VENTOLIN HFA) 108 (90 Base) MCG/ACT inhaler Inhale 2 puffs into the lungs every 4 (four) hours as needed for wheezing or shortness of breath.    Marland Kitchen ammonium lactate (LAC-HYDRIN) 12 % lotion Apply 1 application topically daily as needed for dry skin.    Marland Kitchen aspirin EC 81 MG tablet Take 81 mg by mouth daily.     Marland Kitchen atorvastatin (LIPITOR) 40 MG tablet Take 20 mg by mouth at bedtime.     . carvedilol (COREG) 25 MG tablet Take 25 mg by mouth 2 (two) times daily with a meal.     . cetirizine (ZYRTEC) 10 MG tablet Take 10 mg by mouth daily.    . chlorpheniramine-HYDROcodone (TUSSIONEX  PENNKINETIC ER) 10-8 MG/5ML SUER Take 5 mLs by mouth every 12 (twelve) hours as needed for cough. 140 mL 0  . diclofenac sodium (VOLTAREN) 1 % GEL Apply 1 application topically daily as needed.    . DiphenhydrAMINE HCl (ZZZQUIL) 50 MG/30ML LIQD Take 15 mLs by mouth at bedtime as needed.    . fluticasone (FLONASE) 50 MCG/ACT nasal spray Place 2 sprays into both nostrils daily as needed for allergies or rhinitis.    . polyvinyl alcohol (LIQUIFILM TEARS) 1.4 % ophthalmic solution Place 1 drop into both eyes daily as needed  for dry eyes.    . promethazine (PHENERGAN) 50 MG tablet Take 50 mg by mouth every 6 (six) hours as needed for nausea or vomiting.    . sertraline (ZOLOFT) 25 MG tablet Take 25 mg by mouth at bedtime.    . tamsulosin (FLOMAX) 0.4 MG CAPS capsule Take 0.4 mg by mouth 2 (two) times daily.    . Tiotropium Bromide-Olodaterol (STIOLTO RESPIMAT) 2.5-2.5 MCG/ACT AERS Inhale 2 puffs into the lungs every morning.    . traMADol (ULTRAM) 50 MG tablet Take 50 mg by mouth daily as needed for moderate pain.    . vitamin B-12 (CYANOCOBALAMIN) 1000 MCG tablet Take 1,000 mcg by mouth daily.    Marland Kitchen zolpidem (AMBIEN) 5 MG tablet Take 2.5 mg by mouth at bedtime as needed for sleep (VERY RARELY TAKES).     No current facility-administered medications for this visit.    Facility-Administered Medications Ordered in Other Visits  Medication Dose Route Frequency Provider Last Rate Last Dose  . heparin lock flush 100 unit/mL  500 Units Intravenous Once [name], MD      . sodium chloride flush (NS) 0.9 % injection 10 mL  10 mL Intravenous PRN [name], MD   10 mL at 11/10/16 0835    OBJECTIVE: Vitals:   11/10/16 0900  BP: (!) 136/93  Pulse: 65  Resp: 18  Temp: (!) 97.3 F (36.3 C)     Body mass index is 29.16 kg/m.    ECOG FS:0 - Asymptomatic  General: Well-developed, well-nourished, no acute distress. Eyes: Pink conjunctiva, anicteric sclera. Lungs: Clear to auscultation bilaterally. Heart: Regular rate and rhythm. No rubs, murmurs, or gallops. Abdomen: Soft, nontender, nondistended. No organomegaly noted, normoactive bowel sounds. Musculoskeletal: No edema, cyanosis, or clubbing. Neuro: Alert, answering all questions appropriately. Cranial nerves grossly intact. Skin: No rashes or petechiae noted. Psych: Normal affect.   LAB RESULTS:  Lab Results  Component Value Date   NA 138 11/10/2016   K 3.8 11/10/2016   CL 108 11/10/2016   CO2 27 11/10/2016   GLUCOSE 101 (H) 11/10/2016    BUN 18 11/10/2016   CREATININE 0.92 11/10/2016   CALCIUM 8.4 (L) 11/10/2016   PROT 6.4 (L) 11/10/2016   ALBUMIN 3.5 11/10/2016   AST 19 11/10/2016   ALT 12 (L) 11/10/2016   ALKPHOS 60 11/10/2016   BILITOT 0.6 11/10/2016   GFRNONAA >60 11/10/2016   GFRAA >60 11/10/2016    Lab Results  Component Value Date   WBC 6.4 11/10/2016   NEUTROABS 4.3 11/10/2016   HGB 12.7 (L) 11/10/2016   HCT 37.4 (L) 11/10/2016   MCV 86.1 11/10/2016   PLT 245 11/10/2016     STUDIES: No results found.  ASSESSMENT: Stage IIIA high-grade urothelial carcinoma.  PLAN:    1. Stage IIIA high-grade urothelial carcinoma: Outside CT scans and biopsy results reviewed independently confirming diagnosis and stage. PET scan completed  at the [place] and we are trying to obtain these results. We will obtain the slides from his biopsy and imaging results. Agree with outside recommendation of neoadjuvant chemotherapy using cisplatin and gemcitabine for 3 cycles followed by complete cystectomy. Currently, patient is scheduled for surgery at the [facility] but has expressed desire for local urologist but unfortunately the [facility] will not pay for it to be done local. Patient aware. Patient will receive cisplatin and gemcitabine on day 1 followed by gemcitabine only on days 8 and day 15. This will be a 28 day cycle. Proceed with cycle 2, day 1 today. Return to clinic in 1 week for consideration of cycle 2, day 8 which will be gemcitabine only. Gemcitabine has been dose reduced 20% secondary to thrombocytopenia. 2. Thrombocytopenia: Resolved. Dose reduce gemcitabine as above. 3. Cystectomy: Although patient has expressed a desire to have his surgery locally, given his underlying insurance he may have to proceed with surgery at the [place] as originally planned.  Approximately 30 minutes was spent in discussion of which greater than 50% was consultation.  Patient expressed understanding and was in agreement with this plan. He also understands that  He can call clinic at any time with any questions, concerns, or complaints.   Cancer Staging Bladder cancer [facility]) Staging form: Urinary Bladder, AJCC 8th Edition - Clinical stage from 09/04/2016: Stage IIIA (cT4a, cN0, cM0) - Signed by [name], MD on 09/04/2016   [name], MD   11/10/2016 9:21 AM

## 2016-11-10 ENCOUNTER — Inpatient Hospital Stay (HOSPITAL_BASED_OUTPATIENT_CLINIC_OR_DEPARTMENT_OTHER): Payer: Non-veteran care | Admitting: Oncology

## 2016-11-10 ENCOUNTER — Inpatient Hospital Stay: Payer: Non-veteran care

## 2016-11-10 ENCOUNTER — Inpatient Hospital Stay: Payer: Non-veteran care | Attending: Oncology

## 2016-11-10 VITALS — BP 136/93 | HR 65 | Temp 97.3°F | Resp 18 | Wt 215.0 lb

## 2016-11-10 DIAGNOSIS — R5383 Other fatigue: Secondary | ICD-10-CM | POA: Diagnosis not present

## 2016-11-10 DIAGNOSIS — F17210 Nicotine dependence, cigarettes, uncomplicated: Secondary | ICD-10-CM | POA: Diagnosis not present

## 2016-11-10 DIAGNOSIS — Z880 Allergy status to penicillin: Secondary | ICD-10-CM

## 2016-11-10 DIAGNOSIS — Z7982 Long term (current) use of aspirin: Secondary | ICD-10-CM | POA: Diagnosis not present

## 2016-11-10 DIAGNOSIS — R531 Weakness: Secondary | ICD-10-CM | POA: Insufficient documentation

## 2016-11-10 DIAGNOSIS — I2510 Atherosclerotic heart disease of native coronary artery without angina pectoris: Secondary | ICD-10-CM | POA: Insufficient documentation

## 2016-11-10 DIAGNOSIS — C679 Malignant neoplasm of bladder, unspecified: Secondary | ICD-10-CM

## 2016-11-10 DIAGNOSIS — I252 Old myocardial infarction: Secondary | ICD-10-CM | POA: Insufficient documentation

## 2016-11-10 DIAGNOSIS — R5381 Other malaise: Secondary | ICD-10-CM | POA: Insufficient documentation

## 2016-11-10 DIAGNOSIS — I10 Essential (primary) hypertension: Secondary | ICD-10-CM | POA: Diagnosis not present

## 2016-11-10 DIAGNOSIS — Z8546 Personal history of malignant neoplasm of prostate: Secondary | ICD-10-CM | POA: Diagnosis not present

## 2016-11-10 DIAGNOSIS — Z5111 Encounter for antineoplastic chemotherapy: Secondary | ICD-10-CM | POA: Diagnosis present

## 2016-11-10 DIAGNOSIS — Z79899 Other long term (current) drug therapy: Secondary | ICD-10-CM

## 2016-11-10 DIAGNOSIS — C674 Malignant neoplasm of posterior wall of bladder: Secondary | ICD-10-CM

## 2016-11-10 LAB — CBC WITH DIFFERENTIAL/PLATELET
Basophils Absolute: 0.1 10*3/uL (ref 0–0.1)
Basophils Relative: 1 %
Eosinophils Absolute: 0.2 10*3/uL (ref 0–0.7)
Eosinophils Relative: 3 %
HEMATOCRIT: 37.4 % — AB (ref 40.0–52.0)
HEMOGLOBIN: 12.7 g/dL — AB (ref 13.0–18.0)
LYMPHS ABS: 1.1 10*3/uL (ref 1.0–3.6)
Lymphocytes Relative: 17 %
MCH: 29.3 pg (ref 26.0–34.0)
MCHC: 34 g/dL (ref 32.0–36.0)
MCV: 86.1 fL (ref 80.0–100.0)
MONOS PCT: 12 %
Monocytes Absolute: 0.8 10*3/uL (ref 0.2–1.0)
NEUTROS ABS: 4.3 10*3/uL (ref 1.4–6.5)
NEUTROS PCT: 67 %
Platelets: 245 10*3/uL (ref 150–440)
RBC: 4.34 MIL/uL — ABNORMAL LOW (ref 4.40–5.90)
RDW: 16.4 % — ABNORMAL HIGH (ref 11.5–14.5)
WBC: 6.4 10*3/uL (ref 3.8–10.6)

## 2016-11-10 LAB — COMPREHENSIVE METABOLIC PANEL
ALBUMIN: 3.5 g/dL (ref 3.5–5.0)
ALK PHOS: 60 U/L (ref 38–126)
ALT: 12 U/L — ABNORMAL LOW (ref 17–63)
ANION GAP: 3 — AB (ref 5–15)
AST: 19 U/L (ref 15–41)
BILIRUBIN TOTAL: 0.6 mg/dL (ref 0.3–1.2)
BUN: 18 mg/dL (ref 6–20)
CALCIUM: 8.4 mg/dL — AB (ref 8.9–10.3)
CO2: 27 mmol/L (ref 22–32)
Chloride: 108 mmol/L (ref 101–111)
Creatinine, Ser: 0.92 mg/dL (ref 0.61–1.24)
GFR calc non Af Amer: >60 mL/min (ref >60)
GLUCOSE: 101 mg/dL — AB (ref 65–99)
Potassium: 3.8 mmol/L (ref 3.5–5.1)
Sodium: 138 mmol/L (ref 135–145)
TOTAL PROTEIN: 6.4 g/dL — AB (ref 6.5–8.1)

## 2016-11-10 MED ORDER — SODIUM CHLORIDE 0.9 % IV SOLN
1800.0000 mg | Freq: Once | INTRAVENOUS | Status: AC
  Administered 2016-11-10: 1800 mg via INTRAVENOUS
  Filled 2016-11-10: qty 26.3

## 2016-11-10 MED ORDER — POTASSIUM CHLORIDE 2 MEQ/ML IV SOLN: Freq: Once | INTRAVENOUS | Status: DC

## 2016-11-10 MED ORDER — FOSAPREPITANT DIMEGLUMINE INJECTION 150 MG
Freq: Once | INTRAVENOUS | Status: AC
  Administered 2016-11-10: 13:00:00 via INTRAVENOUS
  Filled 2016-11-10: qty 5

## 2016-11-10 MED ORDER — SODIUM CHLORIDE 0.9 % IV SOLN
70.0000 mg/m2 | Freq: Once | INTRAVENOUS | Status: AC
  Administered 2016-11-10: 155 mg via INTRAVENOUS
  Filled 2016-11-10: qty 155

## 2016-11-10 MED ORDER — SODIUM CHLORIDE 0.9 % IV SOLN
Freq: Once | INTRAVENOUS | Status: AC
  Administered 2016-11-10: 10:00:00 via INTRAVENOUS
  Filled 2016-11-10: qty 1000

## 2016-11-10 MED ORDER — POTASSIUM CHLORIDE 2 MEQ/ML IV SOLN
Freq: Once | INTRAVENOUS | Status: AC
  Administered 2016-11-10: 10:00:00 via INTRAVENOUS
  Filled 2016-11-10: qty 1000

## 2016-11-10 MED ORDER — PALONOSETRON HCL INJECTION 0.25 MG/5ML
0.2500 mg | Freq: Once | INTRAVENOUS | Status: AC
  Administered 2016-11-10: 0.25 mg via INTRAVENOUS
  Filled 2016-11-10: qty 5

## 2016-11-10 MED ORDER — SODIUM CHLORIDE 0.9% FLUSH
10.0000 mL | INTRAVENOUS | Status: DC | PRN
  Administered 2016-11-10: 10 mL via INTRAVENOUS
  Filled 2016-11-10: qty 10

## 2016-11-10 MED ORDER — HEPARIN SOD (PORK) LOCK FLUSH 100 UNIT/ML IV SOLN
500.0000 [IU] | Freq: Once | INTRAVENOUS | Status: AC
  Administered 2016-11-10: 500 [IU] via INTRAVENOUS
  Filled 2016-11-10: qty 5

## 2016-11-10 NOTE — Progress Notes (Signed)
Patient denies any concerns today.  

## 2016-11-16 NOTE — Progress Notes (Signed)
[place]  Telephone:(336) [phone] Fax:(336) [phone]  ID: Gabriel Robbins OB: [birth year]  MR#: [id number]  UQJ#:[id number]  Patient Care Team: [name], NP as PCP - General (Family Medicine)  CHIEF COMPLAINT: Stage IIIA high-grade urothelial carcinoma.  INTERVAL HISTORY: Patient returns to clinic today for further evaluation and consideration of cycle 2, day 8 of cisplatin and gemcitabine. He has increased weakness and fatigue, but otherwise feels well and is tolerating his treatments. He has no neurologic complaints. He denies any recent fevers or illnesses. He has a good appetite and denies weight loss. He has no chest pain or shortness of breath. He denies any nausea, vomiting, constipation, or diarrhea. He has no further urinary complaints. Patient offers no further specific complaints today.  REVIEW OF SYSTEMS:   Review of Systems  Constitutional: Positive for malaise/fatigue. Negative for fever and weight loss.  Respiratory: Negative.  Negative for shortness of breath.   Cardiovascular: Negative.  Negative for chest pain and leg swelling.  Gastrointestinal: Negative.  Negative for abdominal pain.  Genitourinary: Negative.  Negative for hematuria.  Musculoskeletal: Negative.   Skin: Negative.  Negative for rash.  Neurological: Positive for weakness.  Psychiatric/Behavioral: Negative.  The patient is not nervous/anxious.     As per HPI. Otherwise, a complete review of systems is negative.  PAST MEDICAL HISTORY: Past Medical History:  Diagnosis Date  . Allergy   . Coronary artery disease   . Depression   . Hypertension   . Myocardial infarction ([place])   . Prostate cancer ([place])     PAST SURGICAL HISTORY: Past Surgical History:  Procedure Laterality Date  . BLADDER SURGERY    . CARDIAC SURGERY    . PORTA CATH INSERTION N/A 09/22/2016   Procedure: Glori Luis Cath Insertion;  Surgeon: Algernon Huxley, MD;  Location: [facility];  Service:  Cardiovascular;  Laterality: N/A;    FAMILY HISTORY: Family History  Problem Relation Age of Onset  . Thyroid disease Mother   . Heart attack Father   . Heart disease Brother   . Parkinson's disease Brother     ADVANCED DIRECTIVES (Y/N):  N  HEALTH MAINTENANCE: Social History  Substance Use Topics  . Smoking status: Current Every Day Smoker    Packs/day: 0.50    Years: 50.00  . Smokeless tobacco: Never Used  . Alcohol use Yes     Comment: very rare     Colonoscopy:  PAP:  Bone density:  Lipid panel:  Allergies  Allergen Reactions  . Penicillin G Other (See Comments)    Current Outpatient Prescriptions  Medication Sig Dispense Refill  . acetaminophen (TYLENOL) 325 MG tablet Take 650 mg by mouth 2 (two) times daily as needed for mild pain or moderate pain.    Marland Kitchen albuterol (PROVENTIL HFA;VENTOLIN HFA) 108 (90 Base) MCG/ACT inhaler Inhale 2 puffs into the lungs every 4 (four) hours as needed for wheezing or shortness of breath.    Marland Kitchen ammonium lactate (LAC-HYDRIN) 12 % lotion Apply 1 application topically daily as needed for dry skin.    Marland Kitchen aspirin EC 81 MG tablet Take 81 mg by mouth daily.     Marland Kitchen atorvastatin (LIPITOR) 40 MG tablet Take 20 mg by mouth at bedtime.     . carvedilol (COREG) 25 MG tablet Take 25 mg by mouth 2 (two) times daily with a meal.     . cetirizine (ZYRTEC) 10 MG tablet Take 10 mg by mouth daily.    . chlorpheniramine-HYDROcodone (TUSSIONEX  PENNKINETIC ER) 10-8 MG/5ML SUER Take 5 mLs by mouth every 12 (twelve) hours as needed for cough. 140 mL 0  . diclofenac sodium (VOLTAREN) 1 % GEL Apply 1 application topically daily as needed.    . DiphenhydrAMINE HCl (ZZZQUIL) 50 MG/30ML LIQD Take 15 mLs by mouth at bedtime as needed.    . fluticasone (FLONASE) 50 MCG/ACT nasal spray Place 2 sprays into both nostrils daily as needed for allergies or rhinitis.    . polyvinyl alcohol (LIQUIFILM TEARS) 1.4 % ophthalmic solution Place 1 drop into both eyes daily as needed  for dry eyes.    . promethazine (PHENERGAN) 50 MG tablet Take 50 mg by mouth every 6 (six) hours as needed for nausea or vomiting.    . sertraline (ZOLOFT) 25 MG tablet Take 25 mg by mouth at bedtime.    . tamsulosin (FLOMAX) 0.4 MG CAPS capsule Take 0.4 mg by mouth 2 (two) times daily.    . Tiotropium Bromide-Olodaterol (STIOLTO RESPIMAT) 2.5-2.5 MCG/ACT AERS Inhale 2 puffs into the lungs every morning.    . traMADol (ULTRAM) 50 MG tablet Take 50 mg by mouth daily as needed for moderate pain.    . vitamin B-12 (CYANOCOBALAMIN) 1000 MCG tablet Take 1,000 mcg by mouth daily.    Marland Kitchen zolpidem (AMBIEN) 5 MG tablet Take 2.5 mg by mouth at bedtime as needed for sleep (VERY RARELY TAKES).     No current facility-administered medications for this visit.    Facility-Administered Medications Ordered in Other Visits  Medication Dose Route Frequency Provider Last Rate Last Dose  . gemcitabine (GEMZAR) 1,800 mg in sodium chloride 0.9 % 250 mL chemo infusion  1,800 mg Intravenous Once [name], MD 595 mL/hr at 11/17/16 1123 1,800 mg at 11/17/16 1123    OBJECTIVE: Vitals:   11/17/16 0955  BP: 109/73  Pulse: 77  Resp: 18  Temp: (!) 97.1 F (36.2 C)     There is no height or weight on file to calculate BMI.    ECOG FS:0 - Asymptomatic  General: Well-developed, well-nourished, no acute distress. Eyes: Pink conjunctiva, anicteric sclera. Lungs: Clear to auscultation bilaterally. Heart: Regular rate and rhythm. No rubs, murmurs, or gallops. Abdomen: Soft, nontender, nondistended. No organomegaly noted, normoactive bowel sounds. Musculoskeletal: No edema, cyanosis, or clubbing. Neuro: Alert, answering all questions appropriately. Cranial nerves grossly intact. Skin: No rashes or petechiae noted. Psych: Normal affect.   LAB RESULTS:  Lab Results  Component Value Date   NA 134 (L) 11/17/2016   K 3.9 11/17/2016   CL 101 11/17/2016   CO2 26 11/17/2016   GLUCOSE 115 (H) 11/17/2016   BUN  22 (H) 11/17/2016   CREATININE 0.82 11/17/2016   CALCIUM 8.7 (L) 11/17/2016   PROT 6.2 (L) 11/17/2016   ALBUMIN 3.5 11/17/2016   AST 23 11/17/2016   ALT 31 11/17/2016   ALKPHOS 61 11/17/2016   BILITOT 0.8 11/17/2016   GFRNONAA >60 11/17/2016   GFRAA >60 11/17/2016    Lab Results  Component Value Date   WBC 4.5 11/17/2016   NEUTROABS 2.8 11/17/2016   HGB 12.8 (L) 11/17/2016   HCT 37.5 (L) 11/17/2016   MCV 85.8 11/17/2016   PLT 104 (L) 11/17/2016     STUDIES: No results found.  ASSESSMENT: Stage IIIA high-grade urothelial carcinoma.  PLAN:    1. Stage IIIA high-grade urothelial carcinoma: Outside CT scans and biopsy results reviewed independently confirming diagnosis and stage. PET scan completed at the [place] and we are trying  to obtain these results. We will obtain the slides from his biopsy and imaging results. Agree with outside recommendation of neoadjuvant chemotherapy using cisplatin and gemcitabine for 3 cycles followed by complete cystectomy. Currently, patient is scheduled for surgery at the [facility] but has expressed desire for local urologist but unfortunately the [place] will not pay for it to be done local. Patient aware. Patient will receive cisplatin and gemcitabine on day 1 followed by gemcitabine only on days 8 and day 15. This will be a 28 day cycle. Proceed with cycle 2, day 8 today. Return to clinic in 1 week for consideration of cycle 2, day 15. Gemcitabine has been dose reduced 20% secondary to thrombocytopenia. 2. Thrombocytopenia: Plt 104 today. Dose reduce gemcitabine as above. 3. Cystectomy: Although patient has expressed a desire to have his surgery locally, given his underlying insurance he may have to proceed with surgery at the [place] as originally planned. Surgery will be scheduled after his 3 cycles.  Approximately 30 minutes was spent in discussion of which greater than 50% was consultation.  Patient expressed understanding and was in agreement with this plan. He  also understands that He can call clinic at any time with any questions, concerns, or complaints.   Cancer Staging Bladder cancer [facility]) Staging form: Urinary Bladder, AJCC 8th Edition - Clinical stage from 09/04/2016: Stage IIIA (cT4a, cN0, cM0) - Signed by [name], MD on 09/04/2016   Jacquelin Hawking, NP   11/17/2016 11:52 AM

## 2016-11-17 ENCOUNTER — Inpatient Hospital Stay (HOSPITAL_BASED_OUTPATIENT_CLINIC_OR_DEPARTMENT_OTHER): Payer: Non-veteran care | Admitting: Oncology

## 2016-11-17 ENCOUNTER — Inpatient Hospital Stay: Payer: Non-veteran care

## 2016-11-17 VITALS — BP 109/73 | HR 77 | Temp 97.1°F | Resp 18

## 2016-11-17 DIAGNOSIS — R5383 Other fatigue: Secondary | ICD-10-CM | POA: Diagnosis not present

## 2016-11-17 DIAGNOSIS — F17210 Nicotine dependence, cigarettes, uncomplicated: Secondary | ICD-10-CM | POA: Diagnosis not present

## 2016-11-17 DIAGNOSIS — C674 Malignant neoplasm of posterior wall of bladder: Secondary | ICD-10-CM

## 2016-11-17 DIAGNOSIS — C679 Malignant neoplasm of bladder, unspecified: Secondary | ICD-10-CM

## 2016-11-17 DIAGNOSIS — I252 Old myocardial infarction: Secondary | ICD-10-CM

## 2016-11-17 DIAGNOSIS — R531 Weakness: Secondary | ICD-10-CM

## 2016-11-17 DIAGNOSIS — Z5111 Encounter for antineoplastic chemotherapy: Secondary | ICD-10-CM | POA: Diagnosis not present

## 2016-11-17 DIAGNOSIS — I2510 Atherosclerotic heart disease of native coronary artery without angina pectoris: Secondary | ICD-10-CM | POA: Diagnosis not present

## 2016-11-17 DIAGNOSIS — Z8546 Personal history of malignant neoplasm of prostate: Secondary | ICD-10-CM

## 2016-11-17 DIAGNOSIS — R5381 Other malaise: Secondary | ICD-10-CM

## 2016-11-17 DIAGNOSIS — I10 Essential (primary) hypertension: Secondary | ICD-10-CM | POA: Diagnosis not present

## 2016-11-17 DIAGNOSIS — Z880 Allergy status to penicillin: Secondary | ICD-10-CM

## 2016-11-17 DIAGNOSIS — Z79899 Other long term (current) drug therapy: Secondary | ICD-10-CM

## 2016-11-17 DIAGNOSIS — Z7982 Long term (current) use of aspirin: Secondary | ICD-10-CM

## 2016-11-17 LAB — COMPREHENSIVE METABOLIC PANEL
ALK PHOS: 61 U/L (ref 38–126)
ALT: 31 U/L (ref 17–63)
AST: 23 U/L (ref 15–41)
Albumin: 3.5 g/dL (ref 3.5–5.0)
Anion gap: 7 (ref 5–15)
BUN: 22 mg/dL — ABNORMAL HIGH (ref 6–20)
CALCIUM: 8.7 mg/dL — AB (ref 8.9–10.3)
CO2: 26 mmol/L (ref 22–32)
CREATININE: 0.82 mg/dL (ref 0.61–1.24)
Chloride: 101 mmol/L (ref 101–111)
Glucose, Bld: 115 mg/dL — ABNORMAL HIGH (ref 65–99)
Potassium: 3.9 mmol/L (ref 3.5–5.1)
Sodium: 134 mmol/L — ABNORMAL LOW (ref 135–145)
Total Bilirubin: 0.8 mg/dL (ref 0.3–1.2)
Total Protein: 6.2 g/dL — ABNORMAL LOW (ref 6.5–8.1)

## 2016-11-17 LAB — CBC WITH DIFFERENTIAL/PLATELET
Basophils Absolute: 0 10*3/uL (ref 0–0.1)
Basophils Relative: 1 %
Eosinophils Absolute: 0.1 10*3/uL (ref 0–0.7)
Eosinophils Relative: 1 %
HCT: 37.5 % — ABNORMAL LOW (ref 40.0–52.0)
HEMOGLOBIN: 12.8 g/dL — AB (ref 13.0–18.0)
LYMPHS ABS: 1.2 10*3/uL (ref 1.0–3.6)
LYMPHS PCT: 27 %
MCH: 29.3 pg (ref 26.0–34.0)
MCHC: 34.1 g/dL (ref 32.0–36.0)
MCV: 85.8 fL (ref 80.0–100.0)
Monocytes Absolute: 0.3 10*3/uL (ref 0.2–1.0)
Monocytes Relative: 7 %
NEUTROS PCT: 64 %
Neutro Abs: 2.8 10*3/uL (ref 1.4–6.5)
Platelets: 104 10*3/uL — ABNORMAL LOW (ref 150–440)
RBC: 4.37 MIL/uL — AB (ref 4.40–5.90)
RDW: 15.7 % — ABNORMAL HIGH (ref 11.5–14.5)
WBC: 4.5 10*3/uL (ref 3.8–10.6)

## 2016-11-17 MED ORDER — SODIUM CHLORIDE 0.9% FLUSH
10.0000 mL | INTRAVENOUS | Status: DC | PRN
  Administered 2016-11-17: 10 mL via INTRAVENOUS
  Filled 2016-11-17: qty 10

## 2016-11-17 MED ORDER — HEPARIN SOD (PORK) LOCK FLUSH 100 UNIT/ML IV SOLN
500.0000 [IU] | Freq: Once | INTRAVENOUS | Status: AC
  Administered 2016-11-17: 500 [IU] via INTRAVENOUS

## 2016-11-17 MED ORDER — PROCHLORPERAZINE MALEATE 10 MG PO TABS
10.0000 mg | ORAL_TABLET | Freq: Once | ORAL | Status: AC
  Administered 2016-11-17: 10 mg via ORAL
  Filled 2016-11-17: qty 1

## 2016-11-17 MED ORDER — SODIUM CHLORIDE 0.9 % IV SOLN
1800.0000 mg | Freq: Once | INTRAVENOUS | Status: AC
  Administered 2016-11-17: 1800 mg via INTRAVENOUS
  Filled 2016-11-17: qty 26.3

## 2016-11-17 MED ORDER — SODIUM CHLORIDE 0.9 % IV SOLN
Freq: Once | INTRAVENOUS | Status: AC
  Administered 2016-11-17: 11:00:00 via INTRAVENOUS
  Filled 2016-11-17: qty 1000

## 2016-11-24 ENCOUNTER — Inpatient Hospital Stay (HOSPITAL_BASED_OUTPATIENT_CLINIC_OR_DEPARTMENT_OTHER): Payer: Non-veteran care | Admitting: Oncology

## 2016-11-24 ENCOUNTER — Inpatient Hospital Stay: Payer: Non-veteran care

## 2016-11-24 ENCOUNTER — Inpatient Hospital Stay: Payer: Non-veteran care | Attending: Oncology

## 2016-11-24 VITALS — BP 114/83 | HR 83 | Temp 98.0°F | Resp 20 | Wt 217.0 lb

## 2016-11-24 DIAGNOSIS — Z880 Allergy status to penicillin: Secondary | ICD-10-CM

## 2016-11-24 DIAGNOSIS — Z79899 Other long term (current) drug therapy: Secondary | ICD-10-CM | POA: Diagnosis not present

## 2016-11-24 DIAGNOSIS — I252 Old myocardial infarction: Secondary | ICD-10-CM

## 2016-11-24 DIAGNOSIS — I10 Essential (primary) hypertension: Secondary | ICD-10-CM | POA: Insufficient documentation

## 2016-11-24 DIAGNOSIS — Z8546 Personal history of malignant neoplasm of prostate: Secondary | ICD-10-CM | POA: Diagnosis not present

## 2016-11-24 DIAGNOSIS — R5383 Other fatigue: Secondary | ICD-10-CM | POA: Diagnosis not present

## 2016-11-24 DIAGNOSIS — I2510 Atherosclerotic heart disease of native coronary artery without angina pectoris: Secondary | ICD-10-CM

## 2016-11-24 DIAGNOSIS — Z7982 Long term (current) use of aspirin: Secondary | ICD-10-CM | POA: Insufficient documentation

## 2016-11-24 DIAGNOSIS — F17210 Nicotine dependence, cigarettes, uncomplicated: Secondary | ICD-10-CM

## 2016-11-24 DIAGNOSIS — D696 Thrombocytopenia, unspecified: Secondary | ICD-10-CM | POA: Insufficient documentation

## 2016-11-24 DIAGNOSIS — R531 Weakness: Secondary | ICD-10-CM | POA: Diagnosis not present

## 2016-11-24 DIAGNOSIS — C674 Malignant neoplasm of posterior wall of bladder: Secondary | ICD-10-CM | POA: Diagnosis not present

## 2016-11-24 DIAGNOSIS — Z5111 Encounter for antineoplastic chemotherapy: Secondary | ICD-10-CM | POA: Insufficient documentation

## 2016-11-24 DIAGNOSIS — R5381 Other malaise: Secondary | ICD-10-CM

## 2016-11-24 LAB — CBC WITH DIFFERENTIAL/PLATELET
BASOS PCT: 1 %
Basophils Absolute: 0 10*3/uL (ref 0–0.1)
EOS ABS: 0 10*3/uL (ref 0–0.7)
Eosinophils Relative: 1 %
HEMATOCRIT: 35 % — AB (ref 40.0–52.0)
HEMOGLOBIN: 12 g/dL — AB (ref 13.0–18.0)
LYMPHS ABS: 0.8 10*3/uL — AB (ref 1.0–3.6)
Lymphocytes Relative: 32 %
MCH: 29.6 pg (ref 26.0–34.0)
MCHC: 34.4 g/dL (ref 32.0–36.0)
MCV: 85.8 fL (ref 80.0–100.0)
Monocytes Absolute: 0.3 10*3/uL (ref 0.2–1.0)
Monocytes Relative: 12 %
NEUTROS ABS: 1.3 10*3/uL — AB (ref 1.4–6.5)
NEUTROS PCT: 54 %
Platelets: 46 10*3/uL — ABNORMAL LOW (ref 150–440)
RBC: 4.07 MIL/uL — AB (ref 4.40–5.90)
RDW: 15.6 % — ABNORMAL HIGH (ref 11.5–14.5)
WBC: 2.3 10*3/uL — AB (ref 3.8–10.6)

## 2016-11-24 LAB — COMPREHENSIVE METABOLIC PANEL
ALBUMIN: 3.5 g/dL (ref 3.5–5.0)
ALK PHOS: 64 U/L (ref 38–126)
ALT: 16 U/L — AB (ref 17–63)
AST: 18 U/L (ref 15–41)
Anion gap: 7 (ref 5–15)
BUN: 15 mg/dL (ref 6–20)
CALCIUM: 8.7 mg/dL — AB (ref 8.9–10.3)
CO2: 26 mmol/L (ref 22–32)
CREATININE: 0.85 mg/dL (ref 0.61–1.24)
Chloride: 102 mmol/L (ref 101–111)
GFR calc Af Amer: >60 mL/min (ref >60)
GFR calc non Af Amer: >60 mL/min (ref >60)
GLUCOSE: 114 mg/dL — AB (ref 65–99)
Potassium: 4 mmol/L (ref 3.5–5.1)
SODIUM: 135 mmol/L (ref 135–145)
Total Bilirubin: 0.7 mg/dL (ref 0.3–1.2)
Total Protein: 6.4 g/dL — ABNORMAL LOW (ref 6.5–8.1)

## 2016-11-24 MED ORDER — SODIUM CHLORIDE 0.9% FLUSH
10.0000 mL | Freq: Once | INTRAVENOUS | Status: AC
  Administered 2016-11-24: 10 mL via INTRAVENOUS
  Filled 2016-11-24: qty 10

## 2016-11-24 MED ORDER — HEPARIN SOD (PORK) LOCK FLUSH 100 UNIT/ML IV SOLN
500.0000 [IU] | Freq: Once | INTRAVENOUS | Status: AC
  Administered 2016-11-24: 500 [IU] via INTRAVENOUS
  Filled 2016-11-24: qty 5

## 2016-11-24 NOTE — Progress Notes (Signed)
[place]  Telephone:(336) [phone] Fax:(336) [phone]  ID: Gabriel Robbins OB: 1946/01/11  MR#: [id number]  QIO#:[id number]  Patient Care Team: [name], NP as PCP - General (Family Medicine)  CHIEF COMPLAINT: Stage IIIA high-grade urothelial carcinoma.  INTERVAL HISTORY: Patient returns to clinic today for further evaluation and consideration of cycle 2, day 15 of gemcitabine only. He continues to have weakness and fatigue, but otherwise feels well and is tolerating his treatments. He has no neurologic complaints. He denies any recent fevers or illnesses. He has a good appetite and denies weight loss. He has no chest pain or shortness of breath. He denies any nausea, vomiting, constipation, or diarrhea. He has no further urinary complaints. Patient offers no further specific complaints today.  REVIEW OF SYSTEMS:   Review of Systems  Constitutional: Positive for malaise/fatigue. Negative for fever and weight loss.  Respiratory: Negative.  Negative for shortness of breath.   Cardiovascular: Negative.  Negative for chest pain and leg swelling.  Gastrointestinal: Negative.  Negative for abdominal pain.  Genitourinary: Negative.  Negative for hematuria.  Musculoskeletal: Negative.   Skin: Negative.  Negative for rash.  Neurological: Positive for weakness.  Psychiatric/Behavioral: Negative.  The patient is not nervous/anxious.     As per HPI. Otherwise, a complete review of systems is negative.  PAST MEDICAL HISTORY: Past Medical History:  Diagnosis Date  . Allergy   . Coronary artery disease   . Depression   . Hypertension   . Myocardial infarction ([place])   . Prostate cancer ([place])     PAST SURGICAL HISTORY: Past Surgical History:  Procedure Laterality Date  . BLADDER SURGERY    . CARDIAC SURGERY    . PORTA CATH INSERTION N/A 09/22/2016   Procedure: Glori Luis Cath Insertion;  Surgeon: Algernon Huxley, MD;  Location: [place] [facility];  Service:  Cardiovascular;  Laterality: N/A;    FAMILY HISTORY: Family History  Problem Relation Age of Onset  . Thyroid disease Mother   . Heart attack Father   . Heart disease Brother   . Parkinson's disease Brother     ADVANCED DIRECTIVES (Y/N):  N  HEALTH MAINTENANCE: Social History  Substance Use Topics  . Smoking status: Current Every Day Smoker    Packs/day: 0.50    Years: 50.00  . Smokeless tobacco: Never Used  . Alcohol use Yes     Comment: very rare     Colonoscopy:  PAP:  Bone density:  Lipid panel:  Allergies  Allergen Reactions  . Penicillin G Other (See Comments)    Current Outpatient Prescriptions  Medication Sig Dispense Refill  . acetaminophen (TYLENOL) 325 MG tablet Take 650 mg by mouth 2 (two) times daily as needed for mild pain or moderate pain.    Marland Kitchen albuterol (PROVENTIL HFA;VENTOLIN HFA) 108 (90 Base) MCG/ACT inhaler Inhale 2 puffs into the lungs every 4 (four) hours as needed for wheezing or shortness of breath.    Marland Kitchen ammonium lactate (LAC-HYDRIN) 12 % lotion Apply 1 application topically daily as needed for dry skin.    Marland Kitchen aspirin EC 81 MG tablet Take 81 mg by mouth daily.     Marland Kitchen atorvastatin (LIPITOR) 40 MG tablet Take 20 mg by mouth at bedtime.     . carvedilol (COREG) 25 MG tablet Take 25 mg by mouth 2 (two) times daily with a meal.     . cetirizine (ZYRTEC) 10 MG tablet Take 10 mg by mouth daily.    . chlorpheniramine-HYDROcodone (TUSSIONEX  PENNKINETIC ER) 10-8 MG/5ML SUER Take 5 mLs by mouth every 12 (twelve) hours as needed for cough. 140 mL 0  . diclofenac sodium (VOLTAREN) 1 % GEL Apply 1 application topically daily as needed.    . DiphenhydrAMINE HCl (ZZZQUIL) 50 MG/30ML LIQD Take 15 mLs by mouth at bedtime as needed.    . fluticasone (FLONASE) 50 MCG/ACT nasal spray Place 2 sprays into both nostrils daily as needed for allergies or rhinitis.    . polyvinyl alcohol (LIQUIFILM TEARS) 1.4 % ophthalmic solution Place 1 drop into both eyes daily as needed  for dry eyes.    . promethazine (PHENERGAN) 50 MG tablet Take 50 mg by mouth every 6 (six) hours as needed for nausea or vomiting.    . sertraline (ZOLOFT) 25 MG tablet Take 25 mg by mouth at bedtime.    . tamsulosin (FLOMAX) 0.4 MG CAPS capsule Take 0.4 mg by mouth 2 (two) times daily.    . Tiotropium Bromide-Olodaterol (STIOLTO RESPIMAT) 2.5-2.5 MCG/ACT AERS Inhale 2 puffs into the lungs every morning.    . traMADol (ULTRAM) 50 MG tablet Take 50 mg by mouth daily as needed for moderate pain.    . vitamin B-12 (CYANOCOBALAMIN) 1000 MCG tablet Take 1,000 mcg by mouth daily.    Marland Kitchen zolpidem (AMBIEN) 5 MG tablet Take 2.5 mg by mouth at bedtime as needed for sleep (VERY RARELY TAKES).     No current facility-administered medications for this visit.     OBJECTIVE: Vitals:   11/24/16 0906  BP: 114/83  Pulse: 83  Resp: 20  Temp: 98 F (36.7 C)     Body mass index is 29.43 kg/m.    ECOG FS:0 - Asymptomatic  General: Well-developed, well-nourished, no acute distress. Eyes: Pink conjunctiva, anicteric sclera. Lungs: Clear to auscultation bilaterally. Heart: Regular rate and rhythm. No rubs, murmurs, or gallops. Abdomen: Soft, nontender, nondistended. No organomegaly noted, normoactive bowel sounds. Musculoskeletal: No edema, cyanosis, or clubbing. Neuro: Alert, answering all questions appropriately. Cranial nerves grossly intact. Skin: No rashes or petechiae noted. Psych: Normal affect.   LAB RESULTS:  Lab Results  Component Value Date   NA 135 11/24/2016   K 4.0 11/24/2016   CL 102 11/24/2016   CO2 26 11/24/2016   GLUCOSE 114 (H) 11/24/2016   BUN 15 11/24/2016   CREATININE 0.85 11/24/2016   CALCIUM 8.7 (L) 11/24/2016   PROT 6.4 (L) 11/24/2016   ALBUMIN 3.5 11/24/2016   AST 18 11/24/2016   ALT 16 (L) 11/24/2016   ALKPHOS 64 11/24/2016   BILITOT 0.7 11/24/2016   GFRNONAA >60 11/24/2016   GFRAA >60 11/24/2016    Lab Results  Component Value Date   WBC 2.3 (L) 11/24/2016     NEUTROABS 1.3 (L) 11/24/2016   HGB 12.0 (L) 11/24/2016   HCT 35.0 (L) 11/24/2016   MCV 85.8 11/24/2016   PLT 46 (L) 11/24/2016     STUDIES: No results found.  ASSESSMENT: Stage IIIA high-grade urothelial carcinoma.  PLAN:    1. Stage IIIA high-grade urothelial carcinoma: Outside CT scans and biopsy results reviewed independently confirming diagnosis and stage. PET scan completed at the [name] and we are trying to obtain these results. We will obtain the slides from his biopsy and imaging results. Agree with outside recommendation of neoadjuvant chemotherapy using cisplatin and gemcitabine for 3 cycles followed by complete cystectomy. Currently, patient is scheduled for surgery at the [facility] but has expressed desire for local urologist but unfortunately the [place] will not pay  for it to be done local. Patient aware. Patient will receive cisplatin and gemcitabine on day 1 followed by gemcitabine only on days 8 and day 15. This will be a 28 day cycle. No treatment today. Platelet count 46.  Return to clinic in 1 week for re-consideration of cycle 2, day 15. 2. Thrombocytopenia: Plt 46 today. Held treatment today. 3. Cystectomy: Although patient has expressed a desire to have his surgery locally, given his underlying insurance he may have to proceed with surgery at the [place] as originally planned. Surgery will be scheduled after his 3 cycles.   Approximately 30 minutes was spent in discussion of which greater than 50% was consultation.  Patient expressed understanding and was in agreement with this plan. He also understands that He can call clinic at any time with any questions, concerns, or complaints.   Cancer Staging Bladder cancer [facility]) Staging form: Urinary Bladder, AJCC 8th Edition - Clinical stage from 09/04/2016: Stage IIIA (cT4a, cN0, cM0) - Signed by [name], MD on 09/04/2016   Jacquelin Hawking, NP   11/24/2016 3:22 PM

## 2016-11-24 NOTE — Progress Notes (Signed)
Patient reports lower back and hip pain this morning, denies other concerns.

## 2016-11-30 NOTE — Progress Notes (Signed)
[place]  Telephone:(336) [phone] Fax:(336) [phone]  ID: Gabriel Robbins OB: 1945/10/11  MR#: [id number]  TKP#:[id number]  Patient Care Team: [name], NP as PCP - General (Family Medicine)  CHIEF COMPLAINT: Stage IIIA high-grade urothelial carcinoma.  INTERVAL HISTORY: Patient returns to clinic today for further evaluation and reconsideration of cycle 2, day 15 of cisplatin and gemcitabine. Gemcitabine only today. Treatment was delayed last week secondary to thrombocytopenia. He currently feels well and is asymptomatic. He has intermittent weakness and fatigue, but is still able to play golf several times per week. He has no neurologic complaints. He denies any recent fevers or illnesses. He has a good appetite and denies weight loss. He has no chest pain or shortness of breath. He denies any nausea, vomiting, constipation, or diarrhea. He has no further urinary complaints. Patient offers no further specific complaints today.  REVIEW OF SYSTEMS:   Review of Systems  Constitutional: Positive for malaise/fatigue. Negative for fever and weight loss.  Respiratory: Negative.  Negative for shortness of breath.   Cardiovascular: Negative.  Negative for chest pain and leg swelling.  Gastrointestinal: Negative.  Negative for abdominal pain.  Genitourinary: Negative.  Negative for hematuria.  Musculoskeletal: Negative.   Skin: Negative.  Negative for rash.  Neurological: Positive for weakness.  Psychiatric/Behavioral: Negative.  The patient is not nervous/anxious.     As per HPI. Otherwise, a complete review of systems is negative.  PAST MEDICAL HISTORY: Past Medical History:  Diagnosis Date  . Allergy   . Coronary artery disease   . Depression   . Hypertension   . Myocardial infarction ([place])   . Prostate cancer ([place])     PAST SURGICAL HISTORY: Past Surgical History:  Procedure Laterality Date  . BLADDER SURGERY    . CARDIAC SURGERY    . PORTA CATH  INSERTION N/A 09/22/2016   Procedure: Glori Luis Cath Insertion;  Surgeon: Algernon Huxley, MD;  Location: [facility];  Service: Cardiovascular;  Laterality: N/A;    FAMILY HISTORY: Family History  Problem Relation Age of Onset  . Thyroid disease Mother   . Heart attack Father   . Heart disease Brother   . Parkinson's disease Brother     ADVANCED DIRECTIVES (Y/N):  N  HEALTH MAINTENANCE: Social History  Substance Use Topics  . Smoking status: Current Every Day Smoker    Packs/day: 0.50    Years: 50.00  . Smokeless tobacco: Never Used  . Alcohol use Yes     Comment: very rare     Colonoscopy:  PAP:  Bone density:  Lipid panel:  Allergies  Allergen Reactions  . Penicillin G Other (See Comments)    Current Outpatient Prescriptions  Medication Sig Dispense Refill  . acetaminophen (TYLENOL) 325 MG tablet Take 650 mg by mouth 2 (two) times daily as needed for mild pain or moderate pain.    Marland Kitchen albuterol (PROVENTIL HFA;VENTOLIN HFA) 108 (90 Base) MCG/ACT inhaler Inhale 2 puffs into the lungs every 4 (four) hours as needed for wheezing or shortness of breath.    Marland Kitchen ammonium lactate (LAC-HYDRIN) 12 % lotion Apply 1 application topically daily as needed for dry skin.    Marland Kitchen aspirin EC 81 MG tablet Take 81 mg by mouth daily.     Marland Kitchen atorvastatin (LIPITOR) 40 MG tablet Take 20 mg by mouth at bedtime.     . carvedilol (COREG) 25 MG tablet Take 25 mg by mouth 2 (two) times daily with a meal.     .  cetirizine (ZYRTEC) 10 MG tablet Take 10 mg by mouth daily.    . chlorpheniramine-HYDROcodone (TUSSIONEX PENNKINETIC ER) 10-8 MG/5ML SUER Take 5 mLs by mouth every 12 (twelve) hours as needed for cough. 140 mL 0  . diclofenac sodium (VOLTAREN) 1 % GEL Apply 1 application topically daily as needed.    . DiphenhydrAMINE HCl (ZZZQUIL) 50 MG/30ML LIQD Take 15 mLs by mouth at bedtime as needed.    . fluticasone (FLONASE) 50 MCG/ACT nasal spray Place 2 sprays into both nostrils daily as needed for  allergies or rhinitis.    . polyvinyl alcohol (LIQUIFILM TEARS) 1.4 % ophthalmic solution Place 1 drop into both eyes daily as needed for dry eyes.    . promethazine (PHENERGAN) 50 MG tablet Take 50 mg by mouth every 6 (six) hours as needed for nausea or vomiting.    . sertraline (ZOLOFT) 25 MG tablet Take 25 mg by mouth at bedtime.    . tamsulosin (FLOMAX) 0.4 MG CAPS capsule Take 0.4 mg by mouth 2 (two) times daily.    . Tiotropium Bromide-Olodaterol (STIOLTO RESPIMAT) 2.5-2.5 MCG/ACT AERS Inhale 2 puffs into the lungs every morning.    . traMADol (ULTRAM) 50 MG tablet Take 50 mg by mouth daily as needed for moderate pain.    . vitamin B-12 (CYANOCOBALAMIN) 1000 MCG tablet Take 1,000 mcg by mouth daily.    Marland Kitchen zolpidem (AMBIEN) 5 MG tablet Take 2.5 mg by mouth at bedtime as needed for sleep (VERY RARELY TAKES).     No current facility-administered medications for this visit.     OBJECTIVE: Vitals:   12/01/16 0913  BP: 110/74  Pulse: 96  Temp: (!) 97.2 F (36.2 C)     Body mass index is 29.74 kg/m.    ECOG FS:0 - Asymptomatic  General: Well-developed, well-nourished, no acute distress. Eyes: Pink conjunctiva, anicteric sclera. Lungs: Clear to auscultation bilaterally. Heart: Regular rate and rhythm. No rubs, murmurs, or gallops. Abdomen: Soft, nontender, nondistended. No organomegaly noted, normoactive bowel sounds. Musculoskeletal: No edema, cyanosis, or clubbing. Neuro: Alert, answering all questions appropriately. Cranial nerves grossly intact. Skin: No rashes or petechiae noted. Psych: Normal affect.   LAB RESULTS:  Lab Results  Component Value Date   NA 138 12/01/2016   K 4.1 12/01/2016   CL 105 12/01/2016   CO2 26 12/01/2016   GLUCOSE 127 (H) 12/01/2016   BUN 17 12/01/2016   CREATININE 0.86 12/01/2016   CALCIUM 8.4 (L) 12/01/2016   PROT 6.3 (L) 12/01/2016   ALBUMIN 3.6 12/01/2016   AST 18 12/01/2016   ALT 12 (L) 12/01/2016   ALKPHOS 68 12/01/2016   BILITOT 0.5  12/01/2016   GFRNONAA >60 12/01/2016   GFRAA >60 12/01/2016    Lab Results  Component Value Date   WBC 4.2 12/01/2016   NEUTROABS 2.3 12/01/2016   HGB 12.3 (L) 12/01/2016   HCT 35.4 (L) 12/01/2016   MCV 87.2 12/01/2016   PLT 278 12/01/2016     STUDIES: No results found.  ASSESSMENT: Stage IIIA high-grade urothelial carcinoma.  PLAN:    1. Stage IIIA high-grade urothelial carcinoma: Outside CT scans and biopsy results reviewed independently confirming diagnosis and stage. PET scan completed at the [facility] and we are trying to obtain these results. We will obtain the slides from his biopsy and imaging results. Agree with outside recommendation of neoadjuvant chemotherapy using cisplatin and gemcitabine for 3 cycles followed by complete cystectomy. Currently, patient is scheduled for surgery at the [place] but has expressed  desire for local urologist. Patient will receive cisplatin and gemcitabine on day 1 followed by gemcitabine only on days 8 and day 15. This will be a 28 day cycle. Proceed with cycle 2, day 15 today. Return to clinic in 2 weeks for consideration of cycle 3, day 1. Gemcitabine has been dose reduced 20% secondary to thrombocytopenia. 2. Thrombocytopenia: 12th. Proceed with treatment as above. 3. Cystectomy: Although patient has expressed a desire to have his surgery locally, given his underlying insurance he may have to proceed with surgery at the [place] as originally planned. Surgery will be scheduled after his 3 cycles.  Approximately 30 minutes was spent in discussion of which greater than 50% was consultation.  Patient expressed understanding and was in agreement with this plan. He also understands that He can call clinic at any time with any questions, concerns, or complaints.   Cancer Staging Bladder cancer [facility]) Staging form: Urinary Bladder, AJCC 8th Edition - Clinical stage from 09/04/2016: Stage IIIA (cT4a, cN0, cM0) - Signed by [name], MD on  09/04/2016   [name], MD   12/04/2016 6:53 AM

## 2016-12-01 ENCOUNTER — Inpatient Hospital Stay: Payer: Non-veteran care

## 2016-12-01 ENCOUNTER — Inpatient Hospital Stay (HOSPITAL_BASED_OUTPATIENT_CLINIC_OR_DEPARTMENT_OTHER): Payer: Non-veteran care | Admitting: Oncology

## 2016-12-01 ENCOUNTER — Encounter: Payer: Self-pay | Admitting: Oncology

## 2016-12-01 ENCOUNTER — Encounter: Payer: Self-pay | Admitting: Nurse Practitioner

## 2016-12-01 VITALS — BP 110/74 | HR 96 | Temp 97.2°F | Wt 219.3 lb

## 2016-12-01 DIAGNOSIS — I2510 Atherosclerotic heart disease of native coronary artery without angina pectoris: Secondary | ICD-10-CM

## 2016-12-01 DIAGNOSIS — Z880 Allergy status to penicillin: Secondary | ICD-10-CM

## 2016-12-01 DIAGNOSIS — F17210 Nicotine dependence, cigarettes, uncomplicated: Secondary | ICD-10-CM

## 2016-12-01 DIAGNOSIS — R5383 Other fatigue: Secondary | ICD-10-CM

## 2016-12-01 DIAGNOSIS — D696 Thrombocytopenia, unspecified: Secondary | ICD-10-CM | POA: Diagnosis not present

## 2016-12-01 DIAGNOSIS — R531 Weakness: Secondary | ICD-10-CM | POA: Diagnosis not present

## 2016-12-01 DIAGNOSIS — Z7982 Long term (current) use of aspirin: Secondary | ICD-10-CM

## 2016-12-01 DIAGNOSIS — Z8546 Personal history of malignant neoplasm of prostate: Secondary | ICD-10-CM

## 2016-12-01 DIAGNOSIS — C674 Malignant neoplasm of posterior wall of bladder: Secondary | ICD-10-CM | POA: Diagnosis not present

## 2016-12-01 DIAGNOSIS — I252 Old myocardial infarction: Secondary | ICD-10-CM

## 2016-12-01 DIAGNOSIS — I10 Essential (primary) hypertension: Secondary | ICD-10-CM

## 2016-12-01 DIAGNOSIS — R5381 Other malaise: Secondary | ICD-10-CM

## 2016-12-01 DIAGNOSIS — Z5111 Encounter for antineoplastic chemotherapy: Secondary | ICD-10-CM | POA: Diagnosis not present

## 2016-12-01 DIAGNOSIS — Z79899 Other long term (current) drug therapy: Secondary | ICD-10-CM

## 2016-12-01 DIAGNOSIS — C679 Malignant neoplasm of bladder, unspecified: Secondary | ICD-10-CM

## 2016-12-01 LAB — COMPREHENSIVE METABOLIC PANEL
ALK PHOS: 68 U/L (ref 38–126)
ALT: 12 U/L — AB (ref 17–63)
ANION GAP: 7 (ref 5–15)
AST: 18 U/L (ref 15–41)
Albumin: 3.6 g/dL (ref 3.5–5.0)
BILIRUBIN TOTAL: 0.5 mg/dL (ref 0.3–1.2)
BUN: 17 mg/dL (ref 6–20)
CALCIUM: 8.4 mg/dL — AB (ref 8.9–10.3)
CO2: 26 mmol/L (ref 22–32)
CREATININE: 0.86 mg/dL (ref 0.61–1.24)
Chloride: 105 mmol/L (ref 101–111)
GFR calc non Af Amer: >60 mL/min (ref >60)
Glucose, Bld: 127 mg/dL — ABNORMAL HIGH (ref 65–99)
Potassium: 4.1 mmol/L (ref 3.5–5.1)
SODIUM: 138 mmol/L (ref 135–145)
TOTAL PROTEIN: 6.3 g/dL — AB (ref 6.5–8.1)

## 2016-12-01 LAB — CBC WITH DIFFERENTIAL/PLATELET
Basophils Absolute: 0 10*3/uL (ref 0–0.1)
Basophils Relative: 1 %
Eosinophils Absolute: 0.1 10*3/uL (ref 0–0.7)
Eosinophils Relative: 3 %
HEMATOCRIT: 35.4 % — AB (ref 40.0–52.0)
HEMOGLOBIN: 12.3 g/dL — AB (ref 13.0–18.0)
LYMPHS ABS: 1 10*3/uL (ref 1.0–3.6)
LYMPHS PCT: 24 %
MCH: 30.3 pg (ref 26.0–34.0)
MCHC: 34.7 g/dL (ref 32.0–36.0)
MCV: 87.2 fL (ref 80.0–100.0)
MONOS PCT: 18 %
Monocytes Absolute: 0.8 10*3/uL (ref 0.2–1.0)
NEUTROS ABS: 2.3 10*3/uL (ref 1.4–6.5)
Neutrophils Relative %: 54 %
Platelets: 278 10*3/uL (ref 150–440)
RBC: 4.06 MIL/uL — AB (ref 4.40–5.90)
RDW: 17.4 % — ABNORMAL HIGH (ref 11.5–14.5)
WBC: 4.2 10*3/uL (ref 3.8–10.6)

## 2016-12-01 MED ORDER — SODIUM CHLORIDE 0.9 % IV SOLN
Freq: Once | INTRAVENOUS | Status: AC
  Administered 2016-12-01: 11:00:00 via INTRAVENOUS
  Filled 2016-12-01: qty 1000

## 2016-12-01 MED ORDER — SODIUM CHLORIDE 0.9 % IV SOLN
1800.0000 mg | Freq: Once | INTRAVENOUS | Status: AC
  Administered 2016-12-01: 1800 mg via INTRAVENOUS
  Filled 2016-12-01: qty 26.3

## 2016-12-01 MED ORDER — HEPARIN SOD (PORK) LOCK FLUSH 100 UNIT/ML IV SOLN
500.0000 [IU] | Freq: Once | INTRAVENOUS | Status: AC | PRN
  Administered 2016-12-01: 500 [IU]

## 2016-12-01 MED ORDER — PROCHLORPERAZINE MALEATE 10 MG PO TABS
10.0000 mg | ORAL_TABLET | Freq: Once | ORAL | Status: AC
  Administered 2016-12-01: 10 mg via ORAL

## 2016-12-14 NOTE — Progress Notes (Signed)
[place]  Telephone:(336) [phone] Fax:(336) [phone]  ID: Montavious Wierzba OB: Dec 21, 1945  MR#: [id number]  KYH#:[id number]  Patient Care Team: [name], NP as PCP - General (Family Medicine)  CHIEF COMPLAINT: Stage IIIA high-grade urothelial carcinoma.  INTERVAL HISTORY: Patient returns to clinic today for further evaluation and reconsideration of cycle 3, day 1 of cisplatin and gemcitabine. Previously patient has suffered thrombocytopenia which has delayed treatment. He currently feels well and is asymptomatic. He has intermittent weakness and fatigue, but is still able to play golf several times per week. He has no neurologic complaints. He denies any recent fevers or illnesses. He has a good appetite and denies weight loss. He has no chest pain or shortness of breath. He denies any nausea, vomiting, constipation, or diarrhea. He has no further urinary complaints. Patient offers no further specific complaints today.  REVIEW OF SYSTEMS:   Review of Systems  Constitutional: Positive for malaise/fatigue ('I'm just tired'). Negative for fever and weight loss.  Respiratory: Negative.  Negative for shortness of breath.   Cardiovascular: Negative.  Negative for chest pain and leg swelling.  Gastrointestinal: Negative.  Negative for abdominal pain.  Genitourinary: Negative.  Negative for hematuria.  Musculoskeletal: Negative.   Skin: Negative.  Negative for rash.  Neurological: Positive for weakness.  Psychiatric/Behavioral: Negative.  The patient is not nervous/anxious.     As per HPI. Otherwise, a complete review of systems is negative.  PAST MEDICAL HISTORY: Past Medical History:  Diagnosis Date  . Allergy   . Coronary artery disease   . Depression   . Hypertension   . Myocardial infarction ([place])   . Prostate cancer ([place])     PAST SURGICAL HISTORY: Past Surgical History:  Procedure Laterality Date  . BLADDER SURGERY    . CARDIAC SURGERY    .  PORTA CATH INSERTION N/A 09/22/2016   Procedure: Glori Luis Cath Insertion;  Surgeon: Algernon Huxley, MD;  Location: [facility];  Service: Cardiovascular;  Laterality: N/A;    FAMILY HISTORY: Family History  Problem Relation Age of Onset  . Thyroid disease Mother   . Heart attack Father   . Heart disease Brother   . Parkinson's disease Brother     ADVANCED DIRECTIVES (Y/N):  N  HEALTH MAINTENANCE: Social History  Substance Use Topics  . Smoking status: Current Every Day Smoker    Packs/day: 0.50    Years: 50.00  . Smokeless tobacco: Never Used  . Alcohol use Yes     Comment: very rare     Colonoscopy:  PAP:  Bone density:  Lipid panel:  Allergies  Allergen Reactions  . Penicillin G Other (See Comments)    Current Outpatient Prescriptions  Medication Sig Dispense Refill  . acetaminophen (TYLENOL) 325 MG tablet Take 650 mg by mouth 2 (two) times daily as needed for mild pain or moderate pain.    Marland Kitchen albuterol (PROVENTIL HFA;VENTOLIN HFA) 108 (90 Base) MCG/ACT inhaler Inhale 2 puffs into the lungs every 4 (four) hours as needed for wheezing or shortness of breath.    Marland Kitchen ammonium lactate (LAC-HYDRIN) 12 % lotion Apply 1 application topically daily as needed for dry skin.    Marland Kitchen aspirin EC 81 MG tablet Take 81 mg by mouth daily.     Marland Kitchen atorvastatin (LIPITOR) 40 MG tablet Take 20 mg by mouth at bedtime.     . carvedilol (COREG) 25 MG tablet Take 25 mg by mouth 2 (two) times daily with a meal.     .  cetirizine (ZYRTEC) 10 MG tablet Take 10 mg by mouth daily.    . chlorpheniramine-HYDROcodone (TUSSIONEX PENNKINETIC ER) 10-8 MG/5ML SUER Take 5 mLs by mouth every 12 (twelve) hours as needed for cough. 140 mL 0  . diclofenac sodium (VOLTAREN) 1 % GEL Apply 1 application topically daily as needed.    . DiphenhydrAMINE HCl (ZZZQUIL) 50 MG/30ML LIQD Take 15 mLs by mouth at bedtime as needed.    . fluticasone (FLONASE) 50 MCG/ACT nasal spray Place 2 sprays into both nostrils daily as  needed for allergies or rhinitis.    . polyvinyl alcohol (LIQUIFILM TEARS) 1.4 % ophthalmic solution Place 1 drop into both eyes daily as needed for dry eyes.    . promethazine (PHENERGAN) 50 MG tablet Take 50 mg by mouth every 6 (six) hours as needed for nausea or vomiting.    . sertraline (ZOLOFT) 25 MG tablet Take 25 mg by mouth at bedtime.    . tamsulosin (FLOMAX) 0.4 MG CAPS capsule Take 0.4 mg by mouth 2 (two) times daily.    . Tiotropium Bromide-Olodaterol (STIOLTO RESPIMAT) 2.5-2.5 MCG/ACT AERS Inhale 2 puffs into the lungs every morning.    . traMADol (ULTRAM) 50 MG tablet Take 50 mg by mouth daily as needed for moderate pain.    . vitamin B-12 (CYANOCOBALAMIN) 1000 MCG tablet Take 1,000 mcg by mouth daily.    Marland Kitchen zolpidem (AMBIEN) 5 MG tablet Take 2.5 mg by mouth at bedtime as needed for sleep (VERY RARELY TAKES).     No current facility-administered medications for this visit.     OBJECTIVE: Vitals:   12/15/16 0857  BP: 124/80  Pulse: 86  Resp: 18  Temp: (!) 97.5 F (36.4 C)     Body mass index is 30 kg/m.    ECOG FS:0 - Asymptomatic  General: Well-developed, well-nourished, no acute distress. 4 wheel rolling walker for ambulation Eyes: Pink conjunctiva, anicteric sclera. Lungs: Clear to auscultation bilaterally. Heart: Regular rate and rhythm. No rubs, murmurs, or gallops. Abdomen: Soft, nontender, nondistended. No organomegaly noted, normoactive bowel sounds. Musculoskeletal: No edema, cyanosis, or clubbing. Neuro: Alert, answering all questions appropriately. Cranial nerves grossly intact. Skin: No rashes or petechiae noted. Psych: Normal affect.   LAB RESULTS:  Lab Results  Component Value Date   NA 138 12/15/2016   K 3.9 12/15/2016   CL 105 12/15/2016   CO2 24 12/15/2016   GLUCOSE 154 (H) 12/15/2016   BUN 19 12/15/2016   CREATININE 0.80 12/15/2016   CALCIUM 9.0 12/15/2016   PROT 6.7 12/15/2016   ALBUMIN 3.6 12/15/2016   AST 21 12/15/2016   ALT 13 (L)  12/15/2016   ALKPHOS 61 12/15/2016   BILITOT 0.7 12/15/2016   GFRNONAA >60 12/15/2016   GFRAA >60 12/15/2016    Lab Results  Component Value Date   WBC 6.5 12/15/2016   NEUTROABS 4.5 12/15/2016   HGB 12.8 (L) 12/15/2016   HCT 37.5 (L) 12/15/2016   MCV 88.8 12/15/2016   PLT 142 (L) 12/15/2016     STUDIES: No results found.  ASSESSMENT: Stage IIIA high-grade urothelial carcinoma.  PLAN:    1. Stage IIIA high-grade urothelial carcinoma: Outside CT scans and biopsy results reviewed independently confirming diagnosis and stage. PET scan completed at the [facility] and we are trying to obtain these results. We will obtain the slides from his biopsy and imaging results. Agree with outside recommendation of neoadjuvant chemotherapy using cisplatin and gemcitabine for 3 cycles followed by complete cystectomy. Currently, patient is scheduled  for surgery at the [facility] but has expressed desire for local urologist. Patient will receive cisplatin and gemcitabine on day 1 followed by gemcitabine only on days 8 and day 15. This will be a 28 day cycle. Proceed with cycle 3, day 1 today. Return to clinic in 1 week for consideration of cycle 3, day 8. Gemcitabine has been dose reduced 20% secondary to thrombocytopenia. 2. Thrombocytopenia: platelets today 142. Ok to proceed with treatment today. Gemcitabine was dose reduced d/t thrombocytopenia. 3. Cystectomy: Patient scheduled to meet with surgeon at [facility] in next week. He had hoped to have surgery performed locally but unable to do so due to insurance. Surgery anticipated 3-4 weeks after the completion of cycle 3.   Approximately 30 minutes was spent in discussion of which greater than 50% was consultation.  Patient expressed understanding and was in agreement with this plan. He also understands that He can call clinic at any time with any questions, concerns, or complaints.   Cancer Staging Bladder cancer [facility]) Staging form: Urinary Bladder, AJCC 8th Edition -  Clinical stage from 09/04/2016: Stage IIIA (cT4a, cN0, cM0) - Signed by [name], MD on 09/04/2016  Beverely Risen. Zenia Resides, NP 12/15/16 9:39 AM  Patient was seen and evaluated independently and I agree with the assessment and plan as dictated above.  [name], MD 12/17/16 9:09 AM

## 2016-12-15 ENCOUNTER — Inpatient Hospital Stay: Payer: Non-veteran care

## 2016-12-15 ENCOUNTER — Inpatient Hospital Stay (HOSPITAL_BASED_OUTPATIENT_CLINIC_OR_DEPARTMENT_OTHER): Payer: Non-veteran care | Admitting: Oncology

## 2016-12-15 VITALS — BP 124/80 | HR 86 | Temp 97.5°F | Resp 18 | Wt 221.2 lb

## 2016-12-15 DIAGNOSIS — C674 Malignant neoplasm of posterior wall of bladder: Secondary | ICD-10-CM

## 2016-12-15 DIAGNOSIS — I10 Essential (primary) hypertension: Secondary | ICD-10-CM

## 2016-12-15 DIAGNOSIS — R5383 Other fatigue: Secondary | ICD-10-CM

## 2016-12-15 DIAGNOSIS — D696 Thrombocytopenia, unspecified: Secondary | ICD-10-CM

## 2016-12-15 DIAGNOSIS — Z8546 Personal history of malignant neoplasm of prostate: Secondary | ICD-10-CM

## 2016-12-15 DIAGNOSIS — Z880 Allergy status to penicillin: Secondary | ICD-10-CM

## 2016-12-15 DIAGNOSIS — C679 Malignant neoplasm of bladder, unspecified: Secondary | ICD-10-CM

## 2016-12-15 DIAGNOSIS — R5381 Other malaise: Secondary | ICD-10-CM

## 2016-12-15 DIAGNOSIS — I252 Old myocardial infarction: Secondary | ICD-10-CM

## 2016-12-15 DIAGNOSIS — I2510 Atherosclerotic heart disease of native coronary artery without angina pectoris: Secondary | ICD-10-CM

## 2016-12-15 DIAGNOSIS — Z5111 Encounter for antineoplastic chemotherapy: Secondary | ICD-10-CM | POA: Diagnosis not present

## 2016-12-15 DIAGNOSIS — R531 Weakness: Secondary | ICD-10-CM

## 2016-12-15 DIAGNOSIS — Z7982 Long term (current) use of aspirin: Secondary | ICD-10-CM

## 2016-12-15 DIAGNOSIS — F17210 Nicotine dependence, cigarettes, uncomplicated: Secondary | ICD-10-CM

## 2016-12-15 DIAGNOSIS — Z79899 Other long term (current) drug therapy: Secondary | ICD-10-CM

## 2016-12-15 LAB — CBC WITH DIFFERENTIAL/PLATELET
BASOS PCT: 1 %
Basophils Absolute: 0.1 10*3/uL (ref 0–0.1)
EOS ABS: 0.1 10*3/uL (ref 0–0.7)
EOS PCT: 2 %
HCT: 37.5 % — ABNORMAL LOW (ref 40.0–52.0)
Hemoglobin: 12.8 g/dL — ABNORMAL LOW (ref 13.0–18.0)
Lymphocytes Relative: 18 %
Lymphs Abs: 1.2 10*3/uL (ref 1.0–3.6)
MCH: 30.4 pg (ref 26.0–34.0)
MCHC: 34.2 g/dL (ref 32.0–36.0)
MCV: 88.8 fL (ref 80.0–100.0)
Monocytes Absolute: 0.7 10*3/uL (ref 0.2–1.0)
Monocytes Relative: 10 %
Neutro Abs: 4.5 10*3/uL (ref 1.4–6.5)
Neutrophils Relative %: 69 %
PLATELETS: 142 10*3/uL — AB (ref 150–440)
RBC: 4.23 MIL/uL — ABNORMAL LOW (ref 4.40–5.90)
RDW: 19 % — AB (ref 11.5–14.5)
WBC: 6.5 10*3/uL (ref 3.8–10.6)

## 2016-12-15 LAB — COMPREHENSIVE METABOLIC PANEL
ALBUMIN: 3.6 g/dL (ref 3.5–5.0)
ALT: 13 U/L — ABNORMAL LOW (ref 17–63)
ANION GAP: 9 (ref 5–15)
AST: 21 U/L (ref 15–41)
Alkaline Phosphatase: 61 U/L (ref 38–126)
BUN: 19 mg/dL (ref 6–20)
CALCIUM: 9 mg/dL (ref 8.9–10.3)
CHLORIDE: 105 mmol/L (ref 101–111)
CO2: 24 mmol/L (ref 22–32)
Creatinine, Ser: 0.8 mg/dL (ref 0.61–1.24)
GFR calc non Af Amer: >60 mL/min (ref >60)
Glucose, Bld: 154 mg/dL — ABNORMAL HIGH (ref 65–99)
POTASSIUM: 3.9 mmol/L (ref 3.5–5.1)
SODIUM: 138 mmol/L (ref 135–145)
Total Bilirubin: 0.7 mg/dL (ref 0.3–1.2)
Total Protein: 6.7 g/dL (ref 6.5–8.1)

## 2016-12-15 MED ORDER — HEPARIN SOD (PORK) LOCK FLUSH 100 UNIT/ML IV SOLN
500.0000 [IU] | Freq: Once | INTRAVENOUS | Status: AC | PRN
  Administered 2016-12-15: 500 [IU]
  Filled 2016-12-15: qty 5

## 2016-12-15 MED ORDER — POTASSIUM CHLORIDE 2 MEQ/ML IV SOLN
Freq: Once | INTRAVENOUS | Status: AC
  Administered 2016-12-15: 11:00:00 via INTRAVENOUS
  Filled 2016-12-15: qty 1000

## 2016-12-15 MED ORDER — SODIUM CHLORIDE 0.9 % IV SOLN
70.0000 mg/m2 | Freq: Once | INTRAVENOUS | Status: AC
  Administered 2016-12-15: 155 mg via INTRAVENOUS
  Filled 2016-12-15: qty 100

## 2016-12-15 MED ORDER — SODIUM CHLORIDE 0.9 % IV SOLN
1800.0000 mg | Freq: Once | INTRAVENOUS | Status: AC
  Administered 2016-12-15: 1800 mg via INTRAVENOUS
  Filled 2016-12-15: qty 26.3

## 2016-12-15 MED ORDER — PALONOSETRON HCL INJECTION 0.25 MG/5ML
0.2500 mg | Freq: Once | INTRAVENOUS | Status: AC
  Administered 2016-12-15: 0.25 mg via INTRAVENOUS
  Filled 2016-12-15: qty 5

## 2016-12-15 MED ORDER — SODIUM CHLORIDE 0.9 % IV SOLN
Freq: Once | INTRAVENOUS | Status: AC
  Administered 2016-12-15: 13:00:00 via INTRAVENOUS
  Filled 2016-12-15: qty 5

## 2016-12-15 MED ORDER — POTASSIUM CHLORIDE 2 MEQ/ML IV SOLN: Freq: Once | INTRAVENOUS | Status: DC

## 2016-12-15 MED ORDER — SODIUM CHLORIDE 0.9 % IV SOLN
Freq: Once | INTRAVENOUS | Status: AC
  Administered 2016-12-15: 10:00:00 via INTRAVENOUS
  Filled 2016-12-15: qty 1000

## 2016-12-19 NOTE — Progress Notes (Signed)
[place]  Telephone:(336) [phone] Fax:(336) [phone]  ID: Gabriel Robbins OB: October 14, 1945  MR#: [id number]  XVQ#:[id number]  Patient Care Team: [name], NP as PCP - General (Family Medicine)  CHIEF COMPLAINT: Stage IIIA high-grade urothelial carcinoma.  INTERVAL HISTORY: Patient returns to clinic today for further evaluation and consideration of cycle 3, day 1 of cisplatin and gemcitabine. He currently feels well and is asymptomatic. He has intermittent weakness and fatigue. He has no neurologic complaints. He denies any recent fevers or illnesses. He has a good appetite and denies weight loss. He has no chest pain or shortness of breath. He denies any nausea, vomiting, constipation, or diarrhea. He has no further urinary complaints. Patient offers no further specific complaints today.  REVIEW OF SYSTEMS:   Review of Systems  Constitutional: Positive for malaise/fatigue. Negative for fever and weight loss.  Respiratory: Negative.  Negative for shortness of breath.   Cardiovascular: Negative.  Negative for chest pain and leg swelling.  Gastrointestinal: Negative.  Negative for abdominal pain.  Genitourinary: Negative.  Negative for hematuria.  Musculoskeletal: Negative.   Skin: Negative.  Negative for rash.  Neurological: Positive for weakness.  Psychiatric/Behavioral: Negative.  The patient is not nervous/anxious.     As per HPI. Otherwise, a complete review of systems is negative.  PAST MEDICAL HISTORY: Past Medical History:  Diagnosis Date  . Allergy   . Coronary artery disease   . Depression   . Hypertension   . Myocardial infarction ([place])   . Prostate cancer ([place])     PAST SURGICAL HISTORY: Past Surgical History:  Procedure Laterality Date  . BLADDER SURGERY    . CARDIAC SURGERY    . PORTA CATH INSERTION N/A 09/22/2016   Procedure: Glori Luis Cath Insertion;  Surgeon: Algernon Huxley, MD;  Location: [facility];  Service:  Cardiovascular;  Laterality: N/A;    FAMILY HISTORY: Family History  Problem Relation Age of Onset  . Thyroid disease Mother   . Heart attack Father   . Heart disease Brother   . Parkinson's disease Brother     ADVANCED DIRECTIVES (Y/N):  N  HEALTH MAINTENANCE: Social History  Substance Use Topics  . Smoking status: Current Every Day Smoker    Packs/day: 0.50    Years: 50.00  . Smokeless tobacco: Never Used  . Alcohol use Yes     Comment: very rare     Colonoscopy:  PAP:  Bone density:  Lipid panel:  Allergies  Allergen Reactions  . Penicillin G Other (See Comments)    Current Outpatient Prescriptions  Medication Sig Dispense Refill  . acetaminophen (TYLENOL) 325 MG tablet Take 650 mg by mouth 2 (two) times daily as needed for mild pain or moderate pain.    Marland Kitchen albuterol (PROVENTIL HFA;VENTOLIN HFA) 108 (90 Base) MCG/ACT inhaler Inhale 2 puffs into the lungs every 4 (four) hours as needed for wheezing or shortness of breath.    Marland Kitchen ammonium lactate (LAC-HYDRIN) 12 % lotion Apply 1 application topically daily as needed for dry skin.    Marland Kitchen aspirin EC 81 MG tablet Take 81 mg by mouth daily.     Marland Kitchen atorvastatin (LIPITOR) 40 MG tablet Take 20 mg by mouth at bedtime.     . carvedilol (COREG) 25 MG tablet Take 25 mg by mouth 2 (two) times daily with a meal.     . cetirizine (ZYRTEC) 10 MG tablet Take 10 mg by mouth daily.    . chlorpheniramine-HYDROcodone [facility])  10-8 MG/5ML SUER Take 5 mLs by mouth every 12 (twelve) hours as needed for cough. 140 mL 0  . diclofenac sodium (VOLTAREN) 1 % GEL Apply 1 application topically daily as needed.    . DiphenhydrAMINE HCl (ZZZQUIL) 50 MG/30ML LIQD Take 15 mLs by mouth at bedtime as needed.    . fluticasone (FLONASE) 50 MCG/ACT nasal spray Place 2 sprays into both nostrils daily as needed for allergies or rhinitis.    . polyvinyl alcohol (LIQUIFILM TEARS) 1.4 % ophthalmic solution Place 1 drop into both eyes daily as needed  for dry eyes.    . promethazine (PHENERGAN) 50 MG tablet Take 50 mg by mouth every 6 (six) hours as needed for nausea or vomiting.    . sertraline (ZOLOFT) 25 MG tablet Take 25 mg by mouth at bedtime.    . tamsulosin (FLOMAX) 0.4 MG CAPS capsule Take 0.4 mg by mouth 2 (two) times daily.    . Tiotropium Bromide-Olodaterol (STIOLTO RESPIMAT) 2.5-2.5 MCG/ACT AERS Inhale 2 puffs into the lungs every morning.    . traMADol (ULTRAM) 50 MG tablet Take 50 mg by mouth daily as needed for moderate pain.    . vitamin B-12 (CYANOCOBALAMIN) 1000 MCG tablet Take 1,000 mcg by mouth daily.    Marland Kitchen zolpidem (AMBIEN) 5 MG tablet Take 2.5 mg by mouth at bedtime as needed for sleep (VERY RARELY TAKES).     No current facility-administered medications for this visit.    Facility-Administered Medications Ordered in Other Visits  Medication Dose Route Frequency Provider Last Rate Last Dose  . sodium chloride flush (NS) 0.9 % injection 10 mL  10 mL Intracatheter PRN [name], MD   10 mL at 12/22/16 1109    OBJECTIVE: Vitals:   12/22/16 1032  BP: 116/82  Pulse: 78  Resp: 18  Temp: (!) 97.4 F (36.3 C)     Body mass index is 30.07 kg/m.    ECOG FS:0 - Asymptomatic  General: Well-develop  ed, well-nourished, no acute distress. Eyes: Pink conjunctiva, anicteric sclera. Lungs: Clear to auscultation bilaterally. Heart: Regular rate and rhythm. No rubs, murmurs, or gallops. Abdomen: Soft, nontender, nondistended. No organomegaly noted, normoactive bowel sounds. Musculoskeletal: No edema, cyanosis, or clubbing. Neuro: Alert, answering all questions appropriately. Cranial nerves grossly intact. Skin: No rashes or petechiae noted. Psych: Normal affect.   LAB RESULTS:  Lab Results  Component Value Date   NA 136 12/22/2016   K 4.3 12/22/2016   CL 102 12/22/2016   CO2 27 12/22/2016   GLUCOSE 113 (H) 12/22/2016   BUN 19 12/22/2016   CREATININE 0.88 12/22/2016   CALCIUM 8.7 (L) 12/22/2016   PROT  6.5 12/22/2016   ALBUMIN 3.7 12/22/2016   AST 22 12/22/2016   ALT 21 12/22/2016   ALKPHOS 70 12/22/2016   BILITOT 0.4 12/22/2016   GFRNONAA >60 12/22/2016   GFRAA >60 12/22/2016    Lab Results  Component Value Date   WBC 4.4 12/22/2016   NEUTROABS 2.8 12/22/2016   HGB 12.1 (L) 12/22/2016   HCT 35.7 (L) 12/22/2016   MCV 89.4 12/22/2016   PLT 107 (L) 12/22/2016     STUDIES: No results found.  ASSESSMENT: Stage IIIA high-grade urothelial carcinoma.  PLAN:    1. Stage IIIA high-grade urothelial carcinoma: Outside CT scans and biopsy results reviewed independently confirming diagnosis and stage. PET scan completed at the [place].  Agree with outside recommendation of neoadjuvant chemotherapy using cisplatin and gemcitabine for 3 cycles followed by complete cystectomy.  Currently, patient is scheduled for surgery at the [facility] but has expressed desire for local urologist. Patient will receive cisplatin and gemcitabine on day 1 followed by gemcitabine only on days 8 and day 15. This will be a 28 day cycle. Proceed with cycle 3, day 1 today. Return to clinic in 1 week for consideration of cycle 3, day 8. Gemcitabine has been dose reduced 20% secondary to thrombocytopenia. 2. Thrombocytopenia: Mild. Proceed with treatment as above. 3. Cystectomy: Although patient has expressed a desire to have his surgery locally, given his underlying insurance he may have to proceed with surgery at the [place] as originally planned. Surgery will be scheduled after his 3 cycles.  Approximately 30 minutes was spent in discussion of which greater than 50% was consultation.  Patient expressed understanding and was in agreement with this plan. He also understands that He can call clinic at any time with any questions, concerns, or complaints.   Cancer Staging Bladder cancer [facility]) Staging form: Urinary Bladder, AJCC 8th Edition - Clinical stage from 09/04/2016: Stage IIIA (cT4a, cN0, cM0) - Signed by [name], MD  on 09/04/2016   [name], MD   12/22/2016 1:28 PM

## 2016-12-22 ENCOUNTER — Inpatient Hospital Stay: Payer: Non-veteran care

## 2016-12-22 ENCOUNTER — Inpatient Hospital Stay (HOSPITAL_BASED_OUTPATIENT_CLINIC_OR_DEPARTMENT_OTHER): Payer: Non-veteran care | Admitting: Oncology

## 2016-12-22 VITALS — BP 116/82 | HR 78 | Temp 97.4°F | Resp 18 | Wt 221.7 lb

## 2016-12-22 DIAGNOSIS — I2510 Atherosclerotic heart disease of native coronary artery without angina pectoris: Secondary | ICD-10-CM

## 2016-12-22 DIAGNOSIS — Z7982 Long term (current) use of aspirin: Secondary | ICD-10-CM | POA: Diagnosis not present

## 2016-12-22 DIAGNOSIS — C674 Malignant neoplasm of posterior wall of bladder: Secondary | ICD-10-CM

## 2016-12-22 DIAGNOSIS — R5383 Other fatigue: Secondary | ICD-10-CM

## 2016-12-22 DIAGNOSIS — Z5111 Encounter for antineoplastic chemotherapy: Secondary | ICD-10-CM | POA: Diagnosis not present

## 2016-12-22 DIAGNOSIS — Z8546 Personal history of malignant neoplasm of prostate: Secondary | ICD-10-CM

## 2016-12-22 DIAGNOSIS — I252 Old myocardial infarction: Secondary | ICD-10-CM | POA: Diagnosis not present

## 2016-12-22 DIAGNOSIS — F17210 Nicotine dependence, cigarettes, uncomplicated: Secondary | ICD-10-CM

## 2016-12-22 DIAGNOSIS — Z79899 Other long term (current) drug therapy: Secondary | ICD-10-CM

## 2016-12-22 DIAGNOSIS — I10 Essential (primary) hypertension: Secondary | ICD-10-CM | POA: Diagnosis not present

## 2016-12-22 DIAGNOSIS — Z880 Allergy status to penicillin: Secondary | ICD-10-CM | POA: Diagnosis not present

## 2016-12-22 DIAGNOSIS — R5381 Other malaise: Secondary | ICD-10-CM | POA: Diagnosis not present

## 2016-12-22 DIAGNOSIS — D696 Thrombocytopenia, unspecified: Secondary | ICD-10-CM | POA: Diagnosis not present

## 2016-12-22 DIAGNOSIS — C679 Malignant neoplasm of bladder, unspecified: Secondary | ICD-10-CM

## 2016-12-22 DIAGNOSIS — R531 Weakness: Secondary | ICD-10-CM | POA: Diagnosis not present

## 2016-12-22 LAB — COMPREHENSIVE METABOLIC PANEL
ALK PHOS: 70 U/L (ref 38–126)
ALT: 21 U/L (ref 17–63)
ANION GAP: 7 (ref 5–15)
AST: 22 U/L (ref 15–41)
Albumin: 3.7 g/dL (ref 3.5–5.0)
BILIRUBIN TOTAL: 0.4 mg/dL (ref 0.3–1.2)
BUN: 19 mg/dL (ref 6–20)
CALCIUM: 8.7 mg/dL — AB (ref 8.9–10.3)
CO2: 27 mmol/L (ref 22–32)
Chloride: 102 mmol/L (ref 101–111)
Creatinine, Ser: 0.88 mg/dL (ref 0.61–1.24)
GFR calc non Af Amer: >60 mL/min (ref >60)
Glucose, Bld: 113 mg/dL — ABNORMAL HIGH (ref 65–99)
Potassium: 4.3 mmol/L (ref 3.5–5.1)
SODIUM: 136 mmol/L (ref 135–145)
TOTAL PROTEIN: 6.5 g/dL (ref 6.5–8.1)

## 2016-12-22 LAB — CBC WITH DIFFERENTIAL/PLATELET
Basophils Absolute: 0 10*3/uL (ref 0–0.1)
Basophils Relative: 1 %
EOS ABS: 0.1 10*3/uL (ref 0–0.7)
Eosinophils Relative: 2 %
HEMATOCRIT: 35.7 % — AB (ref 40.0–52.0)
HEMOGLOBIN: 12.1 g/dL — AB (ref 13.0–18.0)
LYMPHS ABS: 1 10*3/uL (ref 1.0–3.6)
Lymphocytes Relative: 23 %
MCH: 30.2 pg (ref 26.0–34.0)
MCHC: 33.8 g/dL (ref 32.0–36.0)
MCV: 89.4 fL (ref 80.0–100.0)
MONO ABS: 0.5 10*3/uL (ref 0.2–1.0)
MONOS PCT: 11 %
NEUTROS PCT: 63 %
Neutro Abs: 2.8 10*3/uL (ref 1.4–6.5)
Platelets: 107 10*3/uL — ABNORMAL LOW (ref 150–440)
RBC: 3.99 MIL/uL — ABNORMAL LOW (ref 4.40–5.90)
RDW: 18.1 % — AB (ref 11.5–14.5)
WBC: 4.4 10*3/uL (ref 3.8–10.6)

## 2016-12-22 MED ORDER — PROCHLORPERAZINE MALEATE 10 MG PO TABS
10.0000 mg | ORAL_TABLET | Freq: Once | ORAL | Status: AC
  Administered 2016-12-22: 10 mg via ORAL
  Filled 2016-12-22: qty 1

## 2016-12-22 MED ORDER — HEPARIN SOD (PORK) LOCK FLUSH 100 UNIT/ML IV SOLN
500.0000 [IU] | Freq: Once | INTRAVENOUS | Status: AC | PRN
  Administered 2016-12-22: 500 [IU]
  Filled 2016-12-22: qty 5

## 2016-12-22 MED ORDER — SODIUM CHLORIDE 0.9 % IV SOLN
1800.0000 mg | Freq: Once | INTRAVENOUS | Status: AC
  Administered 2016-12-22: 1800 mg via INTRAVENOUS
  Filled 2016-12-22: qty 26.3

## 2016-12-22 MED ORDER — SODIUM CHLORIDE 0.9 % IV SOLN
Freq: Once | INTRAVENOUS | Status: AC
  Administered 2016-12-22: 11:00:00 via INTRAVENOUS
  Filled 2016-12-22: qty 1000

## 2016-12-22 MED ORDER — SODIUM CHLORIDE 0.9% FLUSH
10.0000 mL | INTRAVENOUS | Status: DC | PRN
  Administered 2016-12-22: 10 mL
  Filled 2016-12-22: qty 10

## 2016-12-24 NOTE — Progress Notes (Signed)
[place]  Telephone:(336) [phone] Fax:(336) [phone]  ID: Gabriel Robbins OB: Sep 20, 1945  MR#: [id number]  ATF#:[id number]  Patient Care Team: [name], NP as PCP - General (Family Medicine)  CHIEF COMPLAINT: Stage IIIA high-grade urothelial carcinoma.  INTERVAL HISTORY: Patient returns to clinic today for further evaluation and consideration of cycle 3, day 15 of cisplatin and gemcitabine. Gemcitabine only. He currently feels well and is asymptomatic. He has intermittent weakness and fatigue. He has no neurologic complaints. He denies any recent fevers or illnesses. He has a good appetite and denies weight loss. He has no chest pain or shortness of breath. He denies any nausea, vomiting, constipation, or diarrhea. He has no further urinary complaints. Patient offers no further specific complaints today.  REVIEW OF SYSTEMS:   Review of Systems  Constitutional: Positive for malaise/fatigue. Negative for fever and weight loss.  Respiratory: Negative.  Negative for shortness of breath.   Cardiovascular: Negative.  Negative for chest pain and leg swelling.  Gastrointestinal: Negative.  Negative for abdominal pain.  Genitourinary: Negative.  Negative for hematuria.  Musculoskeletal: Negative.   Skin: Negative.  Negative for rash.  Neurological: Positive for weakness.  Psychiatric/Behavioral: Negative.  The patient is not nervous/anxious.     As per HPI. Otherwise, a complete review of systems is negative.  PAST MEDICAL HISTORY: Past Medical History:  Diagnosis Date  . Allergy   . Coronary artery disease   . Depression   . Hypertension   . Myocardial infarction ([place])   . Prostate cancer ([place])     PAST SURGICAL HISTORY: Past Surgical History:  Procedure Laterality Date  . BLADDER SURGERY    . CARDIAC SURGERY    . PORTA CATH INSERTION N/A 09/22/2016   Procedure: Glori Luis Cath Insertion;  Surgeon: Algernon Huxley, MD;  Location: [place] [facility];   Service: Cardiovascular;  Laterality: N/A;    FAMILY HISTORY: Family History  Problem Relation Age of Onset  . Thyroid disease Mother   . Heart attack Father   . Heart disease Brother   . Parkinson's disease Brother     ADVANCED DIRECTIVES (Y/N):  N  HEALTH MAINTENANCE: Social History  Substance Use Topics  . Smoking status: Current Every Day Smoker    Packs/day: 0.50    Years: 50.00  . Smokeless tobacco: Never Used  . Alcohol use Yes     Comment: very rare     Colonoscopy:  PAP:  Bone density:  Lipid panel:  Allergies  Allergen Reactions  . Penicillin G Other (See Comments)    Current Outpatient Prescriptions  Medication Sig Dispense Refill  . acetaminophen (TYLENOL) 325 MG tablet Take 650 mg by mouth 2 (two) times daily as needed for mild pain or moderate pain.    Marland Kitchen albuterol (PROVENTIL HFA;VENTOLIN HFA) 108 (90 Base) MCG/ACT inhaler Inhale 2 puffs into the lungs every 4 (four) hours as needed for wheezing or shortness of breath.    Marland Kitchen ammonium lactate (LAC-HYDRIN) 12 % lotion Apply 1 application topically daily as needed for dry skin.    Marland Kitchen aspirin EC 81 MG tablet Take 81 mg by mouth daily.     Marland Kitchen atorvastatin (LIPITOR) 40 MG tablet Take 80 mg by mouth at bedtime.     . carvedilol (COREG) 25 MG tablet Take 25 mg by mouth 2 (two) times daily with a meal.     . cetirizine (ZYRTEC) 10 MG tablet Take 10 mg by mouth daily.    . diclofenac sodium (  VOLTAREN) 1 % GEL Apply 1 application topically daily as needed.    . DiphenhydrAMINE HCl (ZZZQUIL) 50 MG/30ML LIQD Take 15 mLs by mouth at bedtime as needed.    . fluticasone (FLONASE) 50 MCG/ACT nasal spray Place 2 sprays into both nostrils daily as needed for allergies or rhinitis.    . polyvinyl alcohol (LIQUIFILM TEARS) 1.4 % ophthalmic solution Place 1 drop into both eyes daily as needed for dry eyes.    . promethazine (PHENERGAN) 50 MG tablet Take 50 mg by mouth every 6 (six) hours as needed for nausea or vomiting.    .  sertraline (ZOLOFT) 25 MG tablet Take 25 mg by mouth at bedtime.    . tamsulosin (FLOMAX) 0.4 MG CAPS capsule Take 0.4 mg by mouth 2 (two) times daily.    . Tiotropium Bromide-Olodaterol (STIOLTO RESPIMAT) 2.5-2.5 MCG/ACT AERS Inhale 2 puffs into the lungs every morning.    . traMADol (ULTRAM) 50 MG tablet Take 50 mg by mouth daily as needed for moderate pain.    . vitamin B-12 (CYANOCOBALAMIN) 1000 MCG tablet Take 1,000 mcg by mouth daily.    Marland Kitchen zolpidem (AMBIEN) 5 MG tablet Take 2.5 mg by mouth at bedtime as needed for sleep (VERY RARELY TAKES).    . chlorpheniramine-HYDROcodone (TUSSIONEX PENNKINETIC ER) 10-8 MG/5ML SUER Take 5 mLs by mouth every 12 (twelve) hours as needed for cough. (Patient not taking: Reported on 12/29/2016) 140 mL 0   No current facility-administered medications for this visit.     OBJECTIVE: Vitals:   12/29/16 1141  BP: 124/85  Pulse: 80  Resp: 18  Temp: (!) 97.5 F (36.4 C)     Body mass index is 30.52 kg/m.    ECOG FS:0 - Asymptomatic  General: Well-develop  ed, well-nourished, no acute distress. Eyes: Pink conjunctiva, anicteric sclera. Lungs: Clear to auscultation bilaterally. Heart: Regular rate and rhythm. No rubs, murmurs, or gallops. Abdomen: Soft, nontender, nondistended. No organomegaly noted, normoactive bowel sounds. Musculoskeletal: No edema, cyanosis, or clubbing. Neuro: Alert, answering all questions appropriately. Cranial nerves grossly intact. Skin: No rashes or petechiae noted. Psych: Normal affect.   LAB RESULTS:  Lab Results  Component Value Date   NA 139 12/29/2016   K 4.1 12/29/2016   CL 105 12/29/2016   CO2 26 12/29/2016   GLUCOSE 126 (H) 12/29/2016   BUN 17 12/29/2016   CREATININE 0.88 12/29/2016   CALCIUM 8.4 (L) 12/29/2016   PROT 6.0 (L) 12/29/2016   ALBUMIN 3.4 (L) 12/29/2016   AST 17 12/29/2016   ALT 12 (L) 12/29/2016   ALKPHOS 60 12/29/2016   BILITOT 0.4 12/29/2016   GFRNONAA >60 12/29/2016   GFRAA >60 12/29/2016      Lab Results  Component Value Date   WBC 2.4 (L) 12/29/2016   NEUTROABS 1.0 (L) 12/29/2016   HGB 10.7 (L) 12/29/2016   HCT 31.4 (L) 12/29/2016   MCV 89.8 12/29/2016   PLT 43 (L) 12/29/2016     STUDIES: No results found.  ASSESSMENT: Stage IIIA high-grade urothelial carcinoma.  PLAN:    1. Stage IIIA high-grade urothelial carcinoma: Outside CT scans and biopsy results reviewed independently confirming diagnosis and stage. PET scan completed at the [place].  Agree with outside recommendation of neoadjuvant chemotherapy using cisplatin and gemcitabine for 3 cycles followed by complete cystectomy.  Patient received cisplatin and dose reduced gemcitabine on day 1 followed by gemcitabine only on days 8 and day 15. This was a 28 day cycle. Patient was scheduled to  get cycle 3, day 15 of treatment today, but given his thrombocytopenia and neutropenia, will discontinue treatment altogether. He has an appointment with urology at the [facility] tomorrow. He is expressed interest in delaying surgery until the new year and will further discuss this with his surgeon. If he in fact does delay his surgery, can consider a fourth cycle of treatment. Return to clinic in 6 weeks for further evaluation. 2. Thrombocytopenia: Secondary to chemotherapy. Treatment has been discontinued and patient has follow-up with urology tomorrow. 3. Neutropenia: Secondary chemotherapy. No further treatments as above.  Approximately 30 minutes was spent in discussion of which greater than 50% was consultation.  Patient expressed understanding and was in agreement with this plan. He also understands that He can call clinic at any time with any questions, concerns, or complaints.   Cancer Staging Bladder cancer [facility]) Staging form: Urinary Bladder, AJCC 8th Edition - Clinical stage from 09/04/2016: Stage IIIA (cT4a, cN0, cM0) - Signed by [name], MD on 09/04/2016   [name], MD   12/29/2016 1:17 PM

## 2016-12-29 ENCOUNTER — Inpatient Hospital Stay: Payer: Non-veteran care | Attending: Oncology

## 2016-12-29 ENCOUNTER — Inpatient Hospital Stay: Payer: Non-veteran care

## 2016-12-29 ENCOUNTER — Inpatient Hospital Stay (HOSPITAL_BASED_OUTPATIENT_CLINIC_OR_DEPARTMENT_OTHER): Payer: Non-veteran care | Admitting: Oncology

## 2016-12-29 VITALS — BP 124/85 | HR 80 | Temp 97.5°F | Resp 18 | Wt 225.0 lb

## 2016-12-29 DIAGNOSIS — I10 Essential (primary) hypertension: Secondary | ICD-10-CM

## 2016-12-29 DIAGNOSIS — Z8546 Personal history of malignant neoplasm of prostate: Secondary | ICD-10-CM | POA: Insufficient documentation

## 2016-12-29 DIAGNOSIS — Z880 Allergy status to penicillin: Secondary | ICD-10-CM | POA: Diagnosis not present

## 2016-12-29 DIAGNOSIS — I2510 Atherosclerotic heart disease of native coronary artery without angina pectoris: Secondary | ICD-10-CM | POA: Insufficient documentation

## 2016-12-29 DIAGNOSIS — I252 Old myocardial infarction: Secondary | ICD-10-CM | POA: Insufficient documentation

## 2016-12-29 DIAGNOSIS — F17210 Nicotine dependence, cigarettes, uncomplicated: Secondary | ICD-10-CM | POA: Diagnosis not present

## 2016-12-29 DIAGNOSIS — Z79899 Other long term (current) drug therapy: Secondary | ICD-10-CM

## 2016-12-29 DIAGNOSIS — T451X5S Adverse effect of antineoplastic and immunosuppressive drugs, sequela: Secondary | ICD-10-CM | POA: Diagnosis not present

## 2016-12-29 DIAGNOSIS — Z95828 Presence of other vascular implants and grafts: Secondary | ICD-10-CM

## 2016-12-29 DIAGNOSIS — R5381 Other malaise: Secondary | ICD-10-CM | POA: Diagnosis not present

## 2016-12-29 DIAGNOSIS — R5383 Other fatigue: Secondary | ICD-10-CM

## 2016-12-29 DIAGNOSIS — D6959 Other secondary thrombocytopenia: Secondary | ICD-10-CM | POA: Diagnosis not present

## 2016-12-29 DIAGNOSIS — C679 Malignant neoplasm of bladder, unspecified: Secondary | ICD-10-CM

## 2016-12-29 DIAGNOSIS — C674 Malignant neoplasm of posterior wall of bladder: Secondary | ICD-10-CM | POA: Diagnosis present

## 2016-12-29 DIAGNOSIS — Z7982 Long term (current) use of aspirin: Secondary | ICD-10-CM | POA: Diagnosis not present

## 2016-12-29 DIAGNOSIS — R531 Weakness: Secondary | ICD-10-CM

## 2016-12-29 DIAGNOSIS — D701 Agranulocytosis secondary to cancer chemotherapy: Secondary | ICD-10-CM

## 2016-12-29 LAB — COMPREHENSIVE METABOLIC PANEL
ALT: 12 U/L — ABNORMAL LOW (ref 17–63)
ANION GAP: 8 (ref 5–15)
AST: 17 U/L (ref 15–41)
Albumin: 3.4 g/dL — ABNORMAL LOW (ref 3.5–5.0)
Alkaline Phosphatase: 60 U/L (ref 38–126)
BILIRUBIN TOTAL: 0.4 mg/dL (ref 0.3–1.2)
BUN: 17 mg/dL (ref 6–20)
CHLORIDE: 105 mmol/L (ref 101–111)
CO2: 26 mmol/L (ref 22–32)
Calcium: 8.4 mg/dL — ABNORMAL LOW (ref 8.9–10.3)
Creatinine, Ser: 0.88 mg/dL (ref 0.61–1.24)
Glucose, Bld: 126 mg/dL — ABNORMAL HIGH (ref 65–99)
POTASSIUM: 4.1 mmol/L (ref 3.5–5.1)
Sodium: 139 mmol/L (ref 135–145)
TOTAL PROTEIN: 6 g/dL — AB (ref 6.5–8.1)

## 2016-12-29 LAB — CBC WITH DIFFERENTIAL/PLATELET
BASOS ABS: 0 10*3/uL (ref 0–0.1)
Basophils Relative: 1 %
EOS PCT: 1 %
Eosinophils Absolute: 0 10*3/uL (ref 0–0.7)
HEMATOCRIT: 31.4 % — AB (ref 40.0–52.0)
Hemoglobin: 10.7 g/dL — ABNORMAL LOW (ref 13.0–18.0)
LYMPHS PCT: 42 %
Lymphs Abs: 1 10*3/uL (ref 1.0–3.6)
MCH: 30.7 pg (ref 26.0–34.0)
MCHC: 34.2 g/dL (ref 32.0–36.0)
MCV: 89.8 fL (ref 80.0–100.0)
MONO ABS: 0.3 10*3/uL (ref 0.2–1.0)
MONOS PCT: 13 %
NEUTROS ABS: 1 10*3/uL — AB (ref 1.4–6.5)
Neutrophils Relative %: 43 %
PLATELETS: 43 10*3/uL — AB (ref 150–440)
RBC: 3.5 MIL/uL — ABNORMAL LOW (ref 4.40–5.90)
RDW: 17.6 % — AB (ref 11.5–14.5)
WBC: 2.4 10*3/uL — ABNORMAL LOW (ref 3.8–10.6)

## 2016-12-29 MED ORDER — HEPARIN SOD (PORK) LOCK FLUSH 100 UNIT/ML IV SOLN
500.0000 [IU] | Freq: Once | INTRAVENOUS | Status: AC
  Administered 2016-12-29: 500 [IU] via INTRAVENOUS

## 2016-12-29 MED ORDER — HEPARIN SOD (PORK) LOCK FLUSH 100 UNIT/ML IV SOLN
INTRAVENOUS | Status: AC
  Filled 2016-12-29: qty 5

## 2017-01-27 ENCOUNTER — Telehealth: Payer: Self-pay

## 2017-01-27 NOTE — Telephone Encounter (Signed)
Patient called and wanted to let us know he is needing surgery to remove his bladder. His urologist would like to have [place] do 2 more rounds of chemo. Patient would like to have his surgery after the first of the year so he can enjoy the holidays. Per dr Grayland Ormond can see patient next week for lab/finn/ treatment. He is willing to do two more rounds of chemo and that will get him to the first of the year.

## 2017-01-30 NOTE — Progress Notes (Signed)
[place]  Telephone:(336) [phone] Fax:(336) [phone]  ID: Gabriel Robbins OB: October 30, 1945  MR#: [id number]  LZJ#:[id number]  Patient Care Team: [name], NP as PCP - General (Family Medicine)  CHIEF COMPLAINT: Stage IIIA high-grade urothelial carcinoma.  INTERVAL HISTORY: Patient returns to clinic today for further evaluation and consideration of cycle 4, day 1 of cisplatin and gemcitabine. He was evaluated by urology at the [facility] and felt to have residual disease, plus patient wishes to delay his surgery until 2019. He currently feels well and is asymptomatic. He has intermittent weakness and fatigue. He has no neurologic complaints. He denies any recent fevers or illnesses. He has a good appetite and denies weight loss. He has no chest pain or shortness of breath. He denies any nausea, vomiting, constipation, or diarrhea. He has no further urinary complaints. Patient offers no further specific complaints today.  REVIEW OF SYSTEMS:   Review of Systems  Constitutional: Positive for malaise/fatigue. Negative for fever and weight loss.  Respiratory: Negative.  Negative for shortness of breath.   Cardiovascular: Negative.  Negative for chest pain and leg swelling.  Gastrointestinal: Negative.  Negative for abdominal pain.  Genitourinary: Negative.  Negative for hematuria.  Musculoskeletal: Negative.   Skin: Negative.  Negative for rash.  Neurological: Positive for weakness.  Psychiatric/Behavioral: Negative.  The patient is not nervous/anxious.     As per HPI. Otherwise, a complete review of systems is negative.  PAST MEDICAL HISTORY: Past Medical History:  Diagnosis Date  . Allergy   . Coronary artery disease   . Depression   . Hypertension   . Myocardial infarction ([place])   . Prostate cancer ([place])     PAST SURGICAL HISTORY: Past Surgical History:  Procedure Laterality Date  . BLADDER SURGERY    . CARDIAC SURGERY    . PORTA CATH INSERTION N/A  09/22/2016   Procedure: Glori Luis Cath Insertion;  Surgeon: Algernon Huxley, MD;  Location: [place] [facility];  Service: Cardiovascular;  Laterality: N/A;    FAMILY HISTORY: Family History  Problem Relation Age of Onset  . Thyroid disease Mother   . Heart attack Father   . Heart disease Brother   . Parkinson's disease Brother     ADVANCED DIRECTIVES (Y/N):  N  HEALTH MAINTENANCE: Social History  Substance Use Topics  . Smoking status: Current Every Day Smoker    Packs/day: 0.50    Years: 50.00  . Smokeless tobacco: Never Used  . Alcohol use Yes     Comment: very rare     Colonoscopy:  PAP:  Bone density:  Lipid panel:  Allergies  Allergen Reactions  . Penicillin G Other (See Comments)    Current Outpatient Prescriptions  Medication Sig Dispense Refill  . ammonium lactate (LAC-HYDRIN) 12 % lotion Apply 1 application topically daily as needed for dry skin.    Marland Kitchen atorvastatin (LIPITOR) 40 MG tablet Take 80 mg by mouth at bedtime.     . carvedilol (COREG) 25 MG tablet Take 25 mg by mouth 2 (two) times daily with a meal.     . diclofenac sodium (VOLTAREN) 1 % GEL Apply 1 application topically daily as needed.    . fluticasone (FLONASE) 50 MCG/ACT nasal spray Place 2 sprays into both nostrils daily as needed for allergies or rhinitis.    . polyvinyl alcohol (LIQUIFILM TEARS) 1.4 % ophthalmic solution Place 1 drop into both eyes daily as needed for dry eyes.    . promethazine (PHENERGAN) 50 MG tablet  Take 50 mg by mouth every 6 (six) hours as needed for nausea or vomiting.    . sertraline (ZOLOFT) 25 MG tablet Take 25 mg by mouth at bedtime.    . tamsulosin (FLOMAX) 0.4 MG CAPS capsule Take 0.4 mg by mouth 2 (two) times daily.    . Tiotropium Bromide-Olodaterol (STIOLTO RESPIMAT) 2.5-2.5 MCG/ACT AERS Inhale 2 puffs into the lungs every morning.    . vitamin B-12 (CYANOCOBALAMIN) 1000 MCG tablet Take 1,000 mcg by mouth daily.    Marland Kitchen acetaminophen (TYLENOL) 325 MG tablet Take 650 mg  by mouth 2 (two) times daily as needed for mild pain or moderate pain.    Marland Kitchen albuterol (PROVENTIL HFA;VENTOLIN HFA) 108 (90 Base) MCG/ACT inhaler Inhale 2 puffs into the lungs every 4 (four) hours as needed for wheezing or shortness of breath.    Marland Kitchen aspirin EC 81 MG tablet Take 81 mg by mouth daily.     . cetirizine (ZYRTEC) 10 MG tablet Take 10 mg by mouth daily.    . DiphenhydrAMINE HCl (ZZZQUIL) 50 MG/30ML LIQD Take 15 mLs by mouth at bedtime as needed.    . traMADol (ULTRAM) 50 MG tablet Take 50 mg by mouth daily as needed for moderate pain.    Marland Kitchen zolpidem (AMBIEN) 5 MG tablet Take 2.5 mg by mouth at bedtime as needed for sleep (VERY RARELY TAKES).     No current facility-administered medications for this visit.    Facility-Administered Medications Ordered in Other Visits  Medication Dose Route Frequency Provider Last Rate Last Dose  . heparin lock flush 100 unit/mL  500 Units Intravenous Once [name], MD      . sodium chloride flush (NS) 0.9 % injection 10 mL  10 mL Intravenous PRN [name], MD   10 mL at 02/02/17 0851    OBJECTIVE: Vitals:   02/02/17 0934  BP: 132/90  Pulse: 71  Resp: 18  Temp: (!) 96.8 F (36 C)     Body mass index is 30.65 kg/m.    ECOG FS:0 - Asymptomatic  General: Well-develop  ed, well-nourished, no acute distress. Eyes: Pink conjunctiva, anicteric sclera. Lungs: Clear to auscultation bilaterally. Heart: Regular rate and rhythm. No rubs, murmurs, or gallops. Abdomen: Soft, nontender, nondistended. No organomegaly noted, normoactive bowel sounds. Musculoskeletal: No edema, cyanosis, or clubbing. Neuro: Alert, answering all questions appropriately. Cranial nerves grossly intact. Skin: No rashes or petechiae noted. Psych: Normal affect.   LAB RESULTS:  Lab Results  Component Value Date   NA 137 02/02/2017   K 4.2 02/02/2017   CL 104 02/02/2017   CO2 25 02/02/2017   GLUCOSE 108 (H) 02/02/2017   BUN 19 02/02/2017   CREATININE  0.87 02/02/2017   CALCIUM 8.6 (L) 02/02/2017   PROT 6.7 02/02/2017   ALBUMIN 3.8 02/02/2017   AST 17 02/02/2017   ALT 11 (L) 02/02/2017   ALKPHOS 63 02/02/2017   BILITOT 0.8 02/02/2017   GFRNONAA >60 02/02/2017   GFRAA >60 02/02/2017    Lab Results  Component Value Date   WBC 7.0 02/02/2017   NEUTROABS 4.9 02/02/2017   HGB 13.2 02/02/2017   HCT 38.8 (L) 02/02/2017   MCV 92.3 02/02/2017   PLT 179 02/02/2017     STUDIES: No results found.  ASSESSMENT: Stage IIIA high-grade urothelial carcinoma.  PLAN:    1. Stage IIIA high-grade urothelial carcinoma: Outside CT scans and biopsy results reviewed independently confirming diagnosis and stage. PET scan completed at the [place].  Patient  has completed 3 cycles of cisplatin and gemcitabine, but given suspicion of residual disease as well as wishing to delay treatment will give one additional cycle. He will then proceed with complete cystectomy. Patient will continue with cisplatin and dose reduced gemcitabine on day 1 followed by gemcitabine only on days 8 and day 15. This is a 28 day cycle. Patient was not prepared to stay all day for treatment and has requested to delay 1 week. Return to clinic in 1 week to receive cycle 4, day 1 of cisplatin and gemcitabine.  2. Thrombocytopenia: Resolved. Dose reduced gemcitabine as above.  3. Neutropenia: Resolved.  Approximately 30 minutes was spent in discussion of which greater than 50% was consultation.  Patient expressed understanding and was in agreement with this plan. He also understands that He can call clinic at any time with any questions, concerns, or complaints.   Cancer Staging Bladder cancer [facility]) Staging form: Urinary Bladder, AJCC 8th Edition - Clinical stage from 09/04/2016: Stage IIIA (cT4a, cN0, cM0) - Signed by [name], MD on 09/04/2016   [name], MD   02/04/2017 4:46 PM

## 2017-02-01 ENCOUNTER — Other Ambulatory Visit: Payer: Self-pay | Admitting: Oncology

## 2017-02-02 ENCOUNTER — Inpatient Hospital Stay: Payer: Non-veteran care

## 2017-02-02 ENCOUNTER — Inpatient Hospital Stay (HOSPITAL_BASED_OUTPATIENT_CLINIC_OR_DEPARTMENT_OTHER): Payer: Non-veteran care | Admitting: Oncology

## 2017-02-02 ENCOUNTER — Inpatient Hospital Stay: Payer: Non-veteran care | Attending: Oncology

## 2017-02-02 VITALS — BP 132/90 | HR 71 | Temp 96.8°F | Resp 18 | Wt 226.0 lb

## 2017-02-02 DIAGNOSIS — D696 Thrombocytopenia, unspecified: Secondary | ICD-10-CM | POA: Insufficient documentation

## 2017-02-02 DIAGNOSIS — Z5111 Encounter for antineoplastic chemotherapy: Secondary | ICD-10-CM | POA: Diagnosis present

## 2017-02-02 DIAGNOSIS — Z79899 Other long term (current) drug therapy: Secondary | ICD-10-CM | POA: Insufficient documentation

## 2017-02-02 DIAGNOSIS — I2510 Atherosclerotic heart disease of native coronary artery without angina pectoris: Secondary | ICD-10-CM | POA: Diagnosis not present

## 2017-02-02 DIAGNOSIS — Z7982 Long term (current) use of aspirin: Secondary | ICD-10-CM | POA: Diagnosis not present

## 2017-02-02 DIAGNOSIS — R531 Weakness: Secondary | ICD-10-CM | POA: Insufficient documentation

## 2017-02-02 DIAGNOSIS — Z8546 Personal history of malignant neoplasm of prostate: Secondary | ICD-10-CM

## 2017-02-02 DIAGNOSIS — Z880 Allergy status to penicillin: Secondary | ICD-10-CM | POA: Insufficient documentation

## 2017-02-02 DIAGNOSIS — R5383 Other fatigue: Secondary | ICD-10-CM | POA: Diagnosis not present

## 2017-02-02 DIAGNOSIS — I719 Aortic aneurysm of unspecified site, without rupture: Secondary | ICD-10-CM | POA: Insufficient documentation

## 2017-02-02 DIAGNOSIS — R197 Diarrhea, unspecified: Secondary | ICD-10-CM | POA: Insufficient documentation

## 2017-02-02 DIAGNOSIS — R5381 Other malaise: Secondary | ICD-10-CM

## 2017-02-02 DIAGNOSIS — F17210 Nicotine dependence, cigarettes, uncomplicated: Secondary | ICD-10-CM

## 2017-02-02 DIAGNOSIS — I252 Old myocardial infarction: Secondary | ICD-10-CM

## 2017-02-02 DIAGNOSIS — I10 Essential (primary) hypertension: Secondary | ICD-10-CM | POA: Insufficient documentation

## 2017-02-02 DIAGNOSIS — C674 Malignant neoplasm of posterior wall of bladder: Secondary | ICD-10-CM | POA: Diagnosis not present

## 2017-02-02 DIAGNOSIS — R110 Nausea: Secondary | ICD-10-CM | POA: Insufficient documentation

## 2017-02-02 DIAGNOSIS — C679 Malignant neoplasm of bladder, unspecified: Secondary | ICD-10-CM

## 2017-02-02 LAB — COMPREHENSIVE METABOLIC PANEL
ALBUMIN: 3.8 g/dL (ref 3.5–5.0)
ALK PHOS: 63 U/L (ref 38–126)
ALT: 11 U/L — AB (ref 17–63)
AST: 17 U/L (ref 15–41)
Anion gap: 8 (ref 5–15)
BUN: 19 mg/dL (ref 6–20)
CALCIUM: 8.6 mg/dL — AB (ref 8.9–10.3)
CO2: 25 mmol/L (ref 22–32)
CREATININE: 0.87 mg/dL (ref 0.61–1.24)
Chloride: 104 mmol/L (ref 101–111)
GFR calc Af Amer: >60 mL/min (ref >60)
GFR calc non Af Amer: >60 mL/min (ref >60)
GLUCOSE: 108 mg/dL — AB (ref 65–99)
Potassium: 4.2 mmol/L (ref 3.5–5.1)
SODIUM: 137 mmol/L (ref 135–145)
Total Bilirubin: 0.8 mg/dL (ref 0.3–1.2)
Total Protein: 6.7 g/dL (ref 6.5–8.1)

## 2017-02-02 LAB — CBC WITH DIFFERENTIAL/PLATELET
BASOS ABS: 0.1 10*3/uL (ref 0–0.1)
BASOS PCT: 1 %
EOS ABS: 0.3 10*3/uL (ref 0–0.7)
Eosinophils Relative: 4 %
HCT: 38.8 % — ABNORMAL LOW (ref 40.0–52.0)
HEMOGLOBIN: 13.2 g/dL (ref 13.0–18.0)
Lymphocytes Relative: 16 %
Lymphs Abs: 1.1 10*3/uL (ref 1.0–3.6)
MCH: 31.2 pg (ref 26.0–34.0)
MCHC: 33.9 g/dL (ref 32.0–36.0)
MCV: 92.3 fL (ref 80.0–100.0)
Monocytes Absolute: 0.6 10*3/uL (ref 0.2–1.0)
Monocytes Relative: 9 %
NEUTROS PCT: 70 %
Neutro Abs: 4.9 10*3/uL (ref 1.4–6.5)
Platelets: 179 10*3/uL (ref 150–440)
RBC: 4.21 MIL/uL — AB (ref 4.40–5.90)
RDW: 16.4 % — ABNORMAL HIGH (ref 11.5–14.5)
WBC: 7 10*3/uL (ref 3.8–10.6)

## 2017-02-02 MED ORDER — HEPARIN SOD (PORK) LOCK FLUSH 100 UNIT/ML IV SOLN
500.0000 [IU] | Freq: Once | INTRAVENOUS | Status: AC
  Filled 2017-02-02: qty 5

## 2017-02-02 MED ORDER — SODIUM CHLORIDE 0.9% FLUSH
10.0000 mL | INTRAVENOUS | Status: AC | PRN
Start: 2017-02-02 — End: ?
  Administered 2017-02-02: 10 mL via INTRAVENOUS
  Filled 2017-02-02: qty 10

## 2017-02-02 NOTE — Progress Notes (Signed)
Here for follow up. Using scooter he was given by [place].

## 2017-02-07 NOTE — Progress Notes (Signed)
[facility]  Telephone:(336) [phone] Fax:(336) [phone]  ID: Gabriel Robbins OB: [birth year]  MR#: [id number]  TFT#:[id number]  Patient Care Team: [name], NP as PCP - General (Family Medicine)  CHIEF COMPLAINT: Stage IIIA high-grade urothelial carcinoma.  INTERVAL HISTORY: Patient returns to clinic today for further evaluation and reconsideration of cycle 4, day 1 of cisplatin and gemcitabine. He was recently evaluated by urology at the [facility] and felt to have residual disease, plus patient wishes to delay his surgery until 2019. He currently feels well and is asymptomatic. He has intermittent weakness and fatigue. He has no neurologic complaints. He denies any recent fevers or illnesses. He has a good appetite and denies weight loss. He has no chest pain or shortness of breath. He denies any nausea, vomiting, constipation, or diarrhea. He has no further urinary complaints. Patient offers no further specific complaints today.  REVIEW OF SYSTEMS:   Review of Systems  Constitutional: Positive for malaise/fatigue. Negative for fever and weight loss.  Respiratory: Negative.  Negative for shortness of breath.   Cardiovascular: Negative.  Negative for chest pain and leg swelling.  Gastrointestinal: Negative.  Negative for abdominal pain.  Genitourinary: Negative.  Negative for hematuria.  Musculoskeletal: Negative.   Skin: Negative.  Negative for rash.  Neurological: Positive for weakness.  Psychiatric/Behavioral: Negative.  The patient is not nervous/anxious.     As per HPI. Otherwise, a complete review of systems is negative.  PAST MEDICAL HISTORY: Past Medical History:  Diagnosis Date  . Allergy   . Coronary artery disease   . Depression   . Hypertension   . Myocardial infarction ([place])   . Prostate cancer ([place])     PAST SURGICAL HISTORY: Past Surgical History:  Procedure Laterality Date  . BLADDER SURGERY    . CARDIAC SURGERY    . PORTA CATH  INSERTION N/A 09/22/2016   Procedure: Glori Luis Cath Insertion;  Surgeon: Algernon Huxley, MD;  Location: [place] [facility];  Service: Cardiovascular;  Laterality: N/A;    FAMILY HISTORY: Family History  Problem Relation Age of Onset  . Thyroid disease Mother   . Heart attack Father   . Heart disease Brother   . Parkinson's disease Brother     ADVANCED DIRECTIVES (Y/N):  N  HEALTH MAINTENANCE: Social History  Substance Use Topics  . Smoking status: Current Every Day Smoker    Packs/day: 0.50    Years: 50.00  . Smokeless tobacco: Never Used  . Alcohol use Yes     Comment: very rare     Colonoscopy:  PAP:  Bone density:  Lipid panel:  Allergies  Allergen Reactions  . Penicillin G Other (See Comments)    Current Outpatient Prescriptions  Medication Sig Dispense Refill  . acetaminophen (TYLENOL) 325 MG tablet Take 650 mg by mouth 2 (two) times daily as needed for mild pain or moderate pain.    Marland Kitchen albuterol (PROVENTIL HFA;VENTOLIN HFA) 108 (90 Base) MCG/ACT inhaler Inhale 2 puffs into the lungs every 4 (four) hours as needed for wheezing or shortness of breath.    Marland Kitchen ammonium lactate (LAC-HYDRIN) 12 % lotion Apply 1 application topically daily as needed for dry skin.    Marland Kitchen aspirin EC 81 MG tablet Take 81 mg by mouth daily.     Marland Kitchen atorvastatin (LIPITOR) 40 MG tablet Take 80 mg by mouth at bedtime.     . carvedilol (COREG) 25 MG tablet Take 25 mg by mouth 2 (two) times daily with a meal.     .  cetirizine (ZYRTEC) 10 MG tablet Take 10 mg by mouth daily.    . diclofenac sodium (VOLTAREN) 1 % GEL Apply 1 application topically daily as needed.    . DiphenhydrAMINE HCl (ZZZQUIL) 50 MG/30ML LIQD Take 15 mLs by mouth at bedtime as needed.    . fluticasone (FLONASE) 50 MCG/ACT nasal spray Place 2 sprays into both nostrils daily as needed for allergies or rhinitis.    . polyvinyl alcohol (LIQUIFILM TEARS) 1.4 % ophthalmic solution Place 1 drop into both eyes daily as needed for dry eyes.    .  promethazine (PHENERGAN) 50 MG tablet Take 50 mg by mouth every 6 (six) hours as needed for nausea or vomiting.    . sertraline (ZOLOFT) 25 MG tablet Take 25 mg by mouth at bedtime.    . tamsulosin (FLOMAX) 0.4 MG CAPS capsule Take 0.4 mg by mouth 2 (two) times daily.    . Tiotropium Bromide-Olodaterol (STIOLTO RESPIMAT) 2.5-2.5 MCG/ACT AERS Inhale 2 puffs into the lungs every morning.    . traMADol (ULTRAM) 50 MG tablet Take 50 mg by mouth daily as needed for moderate pain.    . vitamin B-12 (CYANOCOBALAMIN) 1000 MCG tablet Take 1,000 mcg by mouth daily.    Marland Kitchen zolpidem (AMBIEN) 5 MG tablet Take 2.5 mg by mouth at bedtime as needed for sleep (VERY RARELY TAKES).     No current facility-administered medications for this visit.    Facility-Administered Medications Ordered in Other Visits  Medication Dose Route Frequency Provider Last Rate Last Dose  . CISplatin (PLATINOL) 155 mg in sodium chloride 0.9 % 500 mL chemo infusion  70 mg/m2 (Treatment Plan Recorded) Intravenous Once [name], MD      . fosaprepitant (EMEND) 150 mg, dexamethasone (DECADRON) 12 mg in sodium chloride 0.9 % 145 mL IVPB   Intravenous Once [name], MD      . gemcitabine (GEMZAR) 1,800 mg in sodium chloride 0.9 % 250 mL chemo infusion  1,800 mg Intravenous Once [name], MD      . heparin lock flush 100 unit/mL  500 Units Intravenous Once [name], MD      . heparin lock flush 100 unit/mL  500 Units Intravenous Once [name], MD      . palonosetron (ALOXI) injection 0.25 mg  0.25 mg Intravenous Once [name], MD      . sodium chloride flush (NS) 0.9 % injection 10 mL  10 mL Intravenous PRN [name], MD   10 mL at 02/02/17 0851  . sodium chloride flush (NS) 0.9 % injection 10 mL  10 mL Intravenous PRN [name], MD   10 mL at 02/09/17 0819    OBJECTIVE: Vitals:   02/09/17 0848  BP: (!) 152/80  Pulse: 76  Resp: 20  Temp: 97.7 F (36.5  C)     Body mass index is 30.57 kg/m.    ECOG FS:0 - Asymptomatic  General: Well-develop  ed, well-nourished, no acute distress. Eyes: Pink conjunctiva, anicteric sclera. Lungs: Clear to auscultation bilaterally. Heart: Regular rate and rhythm. No rubs, murmurs, or gallops. Abdomen: Soft, nontender, nondistended. No organomegaly noted, normoactive bowel sounds. Musculoskeletal: No edema, cyanosis, or clubbing. Neuro: Alert, answering all questions appropriately. Cranial nerves grossly intact. Skin: No rashes or petechiae noted. Psych: Normal affect.   LAB RESULTS:  Lab Results  Component Value Date   NA 139 02/09/2017   K 4.0 02/09/2017   CL 107 02/09/2017  CO2 25 02/09/2017   GLUCOSE 158 (H) 02/09/2017   BUN 20 02/09/2017   CREATININE 0.90 02/09/2017   CALCIUM 8.7 (L) 02/09/2017   PROT 6.4 (L) 02/09/2017   ALBUMIN 3.6 02/09/2017   AST 18 02/09/2017   ALT 10 (L) 02/09/2017   ALKPHOS 63 02/09/2017   BILITOT 0.8 02/09/2017   GFRNONAA >60 02/09/2017   GFRAA >60 02/09/2017    Lab Results  Component Value Date   WBC 6.2 02/09/2017   NEUTROABS 4.4 02/09/2017   HGB 13.2 02/09/2017   HCT 39.6 (L) 02/09/2017   MCV 92.3 02/09/2017   PLT 148 (L) 02/09/2017     STUDIES: No results found.  ASSESSMENT: Stage IIIA high-grade urothelial carcinoma.  PLAN:    1. Stage IIIA high-grade urothelial carcinoma: Outside CT scans and biopsy results reviewed independently confirming diagnosis and stage. PET scan completed at the [place].  Patient has completed 3 cycles of cisplatin and gemcitabine, but given suspicion of residual disease as well as wishing to delay treatment will give one additional cycle. He will then proceed with complete cystectomy. Patient will continue with cisplatin and dose reduced gemcitabine on day 1 followed by gemcitabine only on days 8 and day 15. This is a 28 day cycle. Proceed with cycle 4, day 1 of cisplatin and gemcitabine today. Return to clinic in 1 week  for consideration of cycle 4, day 8 which is gemcitabine only.  2. Thrombocytopenia: Resolved. Dose reduced gemcitabine as above.  3. Neutropenia: Resolved. 4. Aortic aneurysm:Patient plans to have her repair in early December after Thanksgiving. 5. Smoking cessation: Patient states he has now quit cigarette smoking.   Patient expressed understanding and was in agreement with this plan. He also understands that He can call clinic at any time with any questions, concerns, or complaints.   Cancer Staging Bladder cancer [facility]) Staging form: Urinary Bladder, AJCC 8th Edition - Clinical stage from 09/04/2016: Stage IIIA (cT4a, cN0, cM0) - Signed by [name], MD on 09/04/2016   [name], MD   02/09/2017 11:06 AM

## 2017-02-08 ENCOUNTER — Other Ambulatory Visit: Payer: Self-pay | Admitting: Oncology

## 2017-02-09 ENCOUNTER — Inpatient Hospital Stay (HOSPITAL_BASED_OUTPATIENT_CLINIC_OR_DEPARTMENT_OTHER): Payer: Non-veteran care | Admitting: Oncology

## 2017-02-09 ENCOUNTER — Inpatient Hospital Stay: Payer: Non-veteran care

## 2017-02-09 ENCOUNTER — Other Ambulatory Visit: Payer: Non-veteran care

## 2017-02-09 ENCOUNTER — Ambulatory Visit: Payer: Non-veteran care | Admitting: Oncology

## 2017-02-09 VITALS — BP 152/80 | HR 76 | Temp 97.7°F | Resp 20 | Wt 225.4 lb

## 2017-02-09 DIAGNOSIS — Z880 Allergy status to penicillin: Secondary | ICD-10-CM | POA: Diagnosis not present

## 2017-02-09 DIAGNOSIS — Z79899 Other long term (current) drug therapy: Secondary | ICD-10-CM

## 2017-02-09 DIAGNOSIS — C674 Malignant neoplasm of posterior wall of bladder: Secondary | ICD-10-CM | POA: Diagnosis not present

## 2017-02-09 DIAGNOSIS — I2510 Atherosclerotic heart disease of native coronary artery without angina pectoris: Secondary | ICD-10-CM | POA: Diagnosis not present

## 2017-02-09 DIAGNOSIS — R531 Weakness: Secondary | ICD-10-CM | POA: Diagnosis not present

## 2017-02-09 DIAGNOSIS — F17210 Nicotine dependence, cigarettes, uncomplicated: Secondary | ICD-10-CM

## 2017-02-09 DIAGNOSIS — R5383 Other fatigue: Secondary | ICD-10-CM | POA: Diagnosis not present

## 2017-02-09 DIAGNOSIS — Z5111 Encounter for antineoplastic chemotherapy: Secondary | ICD-10-CM | POA: Diagnosis not present

## 2017-02-09 DIAGNOSIS — I719 Aortic aneurysm of unspecified site, without rupture: Secondary | ICD-10-CM

## 2017-02-09 DIAGNOSIS — R5381 Other malaise: Secondary | ICD-10-CM

## 2017-02-09 DIAGNOSIS — Z8546 Personal history of malignant neoplasm of prostate: Secondary | ICD-10-CM

## 2017-02-09 DIAGNOSIS — C679 Malignant neoplasm of bladder, unspecified: Secondary | ICD-10-CM

## 2017-02-09 DIAGNOSIS — I10 Essential (primary) hypertension: Secondary | ICD-10-CM | POA: Diagnosis not present

## 2017-02-09 DIAGNOSIS — Z7982 Long term (current) use of aspirin: Secondary | ICD-10-CM | POA: Diagnosis not present

## 2017-02-09 DIAGNOSIS — I252 Old myocardial infarction: Secondary | ICD-10-CM | POA: Diagnosis not present

## 2017-02-09 LAB — CBC WITH DIFFERENTIAL/PLATELET
BASOS ABS: 0.1 10*3/uL (ref 0–0.1)
Basophils Relative: 1 %
EOS PCT: 4 %
Eosinophils Absolute: 0.2 10*3/uL (ref 0–0.7)
HEMATOCRIT: 39.6 % — AB (ref 40.0–52.0)
HEMOGLOBIN: 13.2 g/dL (ref 13.0–18.0)
LYMPHS PCT: 17 %
Lymphs Abs: 1 10*3/uL (ref 1.0–3.6)
MCH: 30.7 pg (ref 26.0–34.0)
MCHC: 33.2 g/dL (ref 32.0–36.0)
MCV: 92.3 fL (ref 80.0–100.0)
Monocytes Absolute: 0.4 10*3/uL (ref 0.2–1.0)
Monocytes Relative: 6 %
NEUTROS ABS: 4.4 10*3/uL (ref 1.4–6.5)
NEUTROS PCT: 72 %
PLATELETS: 148 10*3/uL — AB (ref 150–440)
RBC: 4.29 MIL/uL — AB (ref 4.40–5.90)
RDW: 15.5 % — ABNORMAL HIGH (ref 11.5–14.5)
WBC: 6.2 10*3/uL (ref 3.8–10.6)

## 2017-02-09 LAB — COMPREHENSIVE METABOLIC PANEL
ALT: 10 U/L — AB (ref 17–63)
AST: 18 U/L (ref 15–41)
Albumin: 3.6 g/dL (ref 3.5–5.0)
Alkaline Phosphatase: 63 U/L (ref 38–126)
Anion gap: 7 (ref 5–15)
BUN: 20 mg/dL (ref 6–20)
CHLORIDE: 107 mmol/L (ref 101–111)
CO2: 25 mmol/L (ref 22–32)
CREATININE: 0.9 mg/dL (ref 0.61–1.24)
Calcium: 8.7 mg/dL — ABNORMAL LOW (ref 8.9–10.3)
GFR calc Af Amer: >60 mL/min (ref >60)
Glucose, Bld: 158 mg/dL — ABNORMAL HIGH (ref 65–99)
Potassium: 4 mmol/L (ref 3.5–5.1)
Sodium: 139 mmol/L (ref 135–145)
Total Bilirubin: 0.8 mg/dL (ref 0.3–1.2)
Total Protein: 6.4 g/dL — ABNORMAL LOW (ref 6.5–8.1)

## 2017-02-09 MED ORDER — SODIUM CHLORIDE 0.9% FLUSH
10.0000 mL | INTRAVENOUS | Status: DC | PRN
  Administered 2017-02-09: 10 mL via INTRAVENOUS
  Filled 2017-02-09: qty 10

## 2017-02-09 MED ORDER — SODIUM CHLORIDE 0.9 % IV SOLN
1800.0000 mg | Freq: Once | INTRAVENOUS | Status: AC
  Administered 2017-02-09: 1800 mg via INTRAVENOUS
  Filled 2017-02-09: qty 26.3

## 2017-02-09 MED ORDER — HEPARIN SOD (PORK) LOCK FLUSH 100 UNIT/ML IV SOLN
500.0000 [IU] | Freq: Once | INTRAVENOUS | Status: AC
  Administered 2017-02-09: 500 [IU] via INTRAVENOUS
  Filled 2017-02-09: qty 5

## 2017-02-09 MED ORDER — POTASSIUM CHLORIDE 2 MEQ/ML IV SOLN
Freq: Once | INTRAVENOUS | Status: AC
  Administered 2017-02-09: 10:00:00 via INTRAVENOUS
  Filled 2017-02-09: qty 1000

## 2017-02-09 MED ORDER — PALONOSETRON HCL INJECTION 0.25 MG/5ML
0.2500 mg | Freq: Once | INTRAVENOUS | Status: AC
  Administered 2017-02-09: 0.25 mg via INTRAVENOUS
  Filled 2017-02-09: qty 5

## 2017-02-09 MED ORDER — SODIUM CHLORIDE 0.9 % IV SOLN
Freq: Once | INTRAVENOUS | Status: AC
Start: 2017-02-09 — End: 2017-02-09
  Administered 2017-02-09: 09:00:00 via INTRAVENOUS
  Filled 2017-02-09: qty 1000

## 2017-02-09 MED ORDER — SODIUM CHLORIDE 0.9 % IV SOLN
Freq: Once | INTRAVENOUS | Status: AC
  Administered 2017-02-09: 12:00:00 via INTRAVENOUS
  Filled 2017-02-09: qty 5

## 2017-02-09 MED ORDER — CISPLATIN CHEMO INJECTION 100MG/100ML
70.0000 mg/m2 | Freq: Once | INTRAVENOUS | Status: AC
  Administered 2017-02-09: 155 mg via INTRAVENOUS
  Filled 2017-02-09: qty 155

## 2017-02-09 NOTE — Progress Notes (Signed)
Pt in for 1 wk follow up and treatment today.  Denies any difficulties. Reports having last cigarette Sunday morning.

## 2017-02-16 ENCOUNTER — Inpatient Hospital Stay: Payer: Non-veteran care

## 2017-02-16 ENCOUNTER — Inpatient Hospital Stay: Payer: Non-veteran care | Admitting: Oncology

## 2017-02-21 NOTE — Progress Notes (Signed)
[place]  [phone] Fax:(336) [phone]  ID: Gabriel Robbins OB: 04-22-1946  MR#: [id number]  CVE#:[id number]  Patient Care Team: [name], NP as PCP - General (Family Medicine)  CHIEF COMPLAINT: Stage IIIA high-grade urothelial carcinoma.  INTERVAL HISTORY: Patient returns to clinic today for further evaluation and consideration of cycle 4, day 8 of cisplatin and gemcitabine.  Gemcitabine only today.  Patient states over the past week he had significant nausea and diarrhea which is now resolved. He currently feels well and is asymptomatic. He has intermittent weakness and fatigue. He has no neurologic complaints. He denies any recent fevers or illnesses. He has a good appetite and denies weight loss. He has no chest pain or shortness of breath. He has no further urinary complaints. Patient offers no further specific complaints today.  REVIEW OF SYSTEMS:   Review of Systems  Constitutional: Positive for malaise/fatigue. Negative for fever and weight loss.  Respiratory: Negative.  Negative for shortness of breath.   Cardiovascular: Negative.  Negative for chest pain and leg swelling.  Gastrointestinal: Positive for diarrhea and nausea. Negative for abdominal pain.  Genitourinary: Negative.  Negative for hematuria.  Musculoskeletal: Negative.   Skin: Negative.  Negative for rash.  Neurological: Positive for weakness.  Psychiatric/Behavioral: Negative.  The patient is not nervous/anxious.     As per HPI. Otherwise, a complete review of systems is negative.  PAST MEDICAL HISTORY: Past Medical History:  Diagnosis Date  . Allergy   . Coronary artery disease   . Depression   . Hypertension   . Myocardial infarction ([place])   . Prostate cancer ([place])     PAST SURGICAL HISTORY: Past Surgical History:  Procedure Laterality Date  . BLADDER SURGERY    . CARDIAC SURGERY    . PORTA CATH INSERTION N/A 09/22/2016   Procedure: Glori Luis Cath Insertion;   Surgeon: Algernon Huxley, MD;  Location: [facility];  Service: Cardiovascular;  Laterality: N/A;    FAMILY HISTORY: Family History  Problem Relation Age of Onset  . Thyroid disease Mother   . Heart attack Father   . Heart disease Brother   . Parkinson's disease Brother     ADVANCED DIRECTIVES (Y/N):  N  HEALTH MAINTENANCE: Social History  Substance Use Topics  . Smoking status: Former Smoker    Packs/day: 0.50    Years: 50.00  . Smokeless tobacco: Never Used  . Alcohol use Yes     Comment: very rare     Colonoscopy:  PAP:  Bone density:  Lipid panel:  Allergies  Allergen Reactions  . Penicillin G Other (See Comments)    Current Outpatient Prescriptions  Medication Sig Dispense Refill  . acetaminophen (TYLENOL) 325 MG tablet Take 650 mg by mouth 2 (two) times daily as needed for mild pain or moderate pain.    Marland Kitchen albuterol (PROVENTIL HFA;VENTOLIN HFA) 108 (90 Base) MCG/ACT inhaler Inhale 2 puffs into the lungs every 4 (four) hours as needed for wheezing or shortness of breath.    Marland Kitchen ammonium lactate (LAC-HYDRIN) 12 % lotion Apply 1 application topically daily as needed for dry skin.    Marland Kitchen atorvastatin (LIPITOR) 40 MG tablet Take 80 mg by mouth at bedtime.     . carvedilol (COREG) 25 MG tablet Take 25 mg by mouth 2 (two) times daily with a meal.     . cetirizine (ZYRTEC) 10 MG tablet Take 10 mg by mouth daily.    . diclofenac sodium (VOLTAREN) 1 % GEL  Apply 1 application topically daily as needed.    . fluticasone (FLONASE) 50 MCG/ACT nasal spray Place 2 sprays into both nostrils daily as needed for allergies or rhinitis.    . polyvinyl alcohol (LIQUIFILM TEARS) 1.4 % ophthalmic solution Place 1 drop into both eyes daily as needed for dry eyes.    . promethazine (PHENERGAN) 50 MG tablet Take 50 mg by mouth every 6 (six) hours as needed for nausea or vomiting.    . sertraline (ZOLOFT) 25 MG tablet Take 25 mg by mouth at bedtime.    . tamsulosin (FLOMAX) 0.4 MG CAPS  capsule Take 0.4 mg by mouth daily.     . Tiotropium Bromide-Olodaterol (STIOLTO RESPIMAT) 2.5-2.5 MCG/ACT AERS Inhale 2 puffs into the lungs every morning.    . vitamin B-12 (CYANOCOBALAMIN) 1000 MCG tablet Take 1,000 mcg by mouth daily.    Marland Kitchen zolpidem (AMBIEN) 5 MG tablet Take 2.5 mg by mouth at bedtime as needed for sleep (VERY RARELY TAKES).     No current facility-administered medications for this visit.    Facility-Administered Medications Ordered in Other Visits  Medication Dose Route Frequency Provider Last Rate Last Dose  . heparin lock flush 100 unit/mL  500 Units Intravenous Once [name], MD      . sodium chloride flush (NS) 0.9 % injection 10 mL  10 mL Intravenous PRN [name], MD   10 mL at 02/02/17 0851    OBJECTIVE: Vitals:   02/23/17 1011  BP: 131/89  Pulse: 74  Resp: 16  Temp: 98.3 F (36.8 C)     Body mass index is 31.19 kg/m.    ECOG FS:0 - Asymptomatic  General: Well-develop  ed, well-nourished, no acute distress. Eyes: Pink conjunctiva, anicteric sclera. Lungs: Clear to auscultation bilaterally. Heart: Regular rate and rhythm. No rubs, murmurs, or gallops. Abdomen: Soft, nontender, nondistended. No organomegaly noted, normoactive bowel sounds. Musculoskeletal: No edema, cyanosis, or clubbing. Neuro: Alert, answering all questions appropriately. Cranial nerves grossly intact. Skin: No rashes or petechiae noted. Psych: Normal affect.   LAB RESULTS:  Lab Results  Component Value Date   NA 137 02/23/2017   K 4.1 02/23/2017   CL 104 02/23/2017   CO2 27 02/23/2017   GLUCOSE 123 (H) 02/23/2017   BUN 20 02/23/2017   CREATININE 0.81 02/23/2017   CALCIUM 8.7 (L) 02/23/2017   PROT 6.3 (L) 02/23/2017   ALBUMIN 3.6 02/23/2017   AST 15 02/23/2017   ALT 12 (L) 02/23/2017   ALKPHOS 62 02/23/2017   BILITOT 0.6 02/23/2017   GFRNONAA >60 02/23/2017   GFRAA >60 02/23/2017    Lab Results  Component Value Date   WBC 5.2 02/23/2017    NEUTROABS 3.8 02/23/2017   HGB 12.5 (L) 02/23/2017   HCT 37.7 (L) 02/23/2017   MCV 91.8 02/23/2017   PLT 102 (L) 02/23/2017     STUDIES: No results found.  ASSESSMENT: Stage IIIA high-grade urothelial carcinoma.  PLAN:    1. Stage IIIA high-grade urothelial carcinoma: Outside CT scans and biopsy results reviewed independently confirming diagnosis and stage. PET scan completed at the [place].  Patient has completed 3 cycles of cisplatin and gemcitabine, but given suspicion of residual disease as well as wishing to delay surgery until 2019 will give one additional cycle. He will then proceed with complete cystectomy. Patient will continue with cisplatin and dose reduced gemcitabine on day 1 followed by gemcitabine only on days 8 and day 15. This is a 28 day cycle. Proceed  with cycle 4, day 8 of gemcitabine only today. Return to clinic in 1 week for consideration of cycle 4, day 15.  2. Thrombocytopenia: Proceed with treatment today.  Dose reduced gemcitabine as above.  3. Neutropenia: Resolved. 4. Aortic aneurysm:Patient plans to have her repair in early December after Thanksgiving. 5. Smoking cessation: Patient states he has now quit cigarette smoking. 6.  Nausea/diarrhea: Possibly related to chemotherapy, but is now resolved.  Monitor.   Patient expressed understanding and was in agreement with this plan. He also understands that He can call clinic at any time with any questions, concerns, or complaints.   Cancer Staging Bladder cancer [facility]) Staging form: Urinary Bladder, AJCC 8th Edition - Clinical stage from 09/04/2016: Stage IIIA (cT4a, cN0, cM0) - Signed by [name], MD on 09/04/2016   [name], MD   02/23/2017 10:39 AM

## 2017-02-22 ENCOUNTER — Other Ambulatory Visit: Payer: Self-pay | Admitting: Oncology

## 2017-02-23 ENCOUNTER — Inpatient Hospital Stay (HOSPITAL_BASED_OUTPATIENT_CLINIC_OR_DEPARTMENT_OTHER): Payer: Non-veteran care | Admitting: Oncology

## 2017-02-23 ENCOUNTER — Encounter: Payer: Self-pay | Admitting: Oncology

## 2017-02-23 ENCOUNTER — Inpatient Hospital Stay: Payer: Non-veteran care

## 2017-02-23 VITALS — BP 131/89 | HR 74 | Temp 98.3°F | Resp 16 | Wt 230.0 lb

## 2017-02-23 DIAGNOSIS — R197 Diarrhea, unspecified: Secondary | ICD-10-CM | POA: Diagnosis not present

## 2017-02-23 DIAGNOSIS — C674 Malignant neoplasm of posterior wall of bladder: Secondary | ICD-10-CM | POA: Diagnosis not present

## 2017-02-23 DIAGNOSIS — F17210 Nicotine dependence, cigarettes, uncomplicated: Secondary | ICD-10-CM

## 2017-02-23 DIAGNOSIS — Z5111 Encounter for antineoplastic chemotherapy: Secondary | ICD-10-CM | POA: Diagnosis not present

## 2017-02-23 DIAGNOSIS — D696 Thrombocytopenia, unspecified: Secondary | ICD-10-CM

## 2017-02-23 DIAGNOSIS — Z8546 Personal history of malignant neoplasm of prostate: Secondary | ICD-10-CM

## 2017-02-23 DIAGNOSIS — I252 Old myocardial infarction: Secondary | ICD-10-CM

## 2017-02-23 DIAGNOSIS — R531 Weakness: Secondary | ICD-10-CM | POA: Diagnosis not present

## 2017-02-23 DIAGNOSIS — I2510 Atherosclerotic heart disease of native coronary artery without angina pectoris: Secondary | ICD-10-CM | POA: Diagnosis not present

## 2017-02-23 DIAGNOSIS — I10 Essential (primary) hypertension: Secondary | ICD-10-CM | POA: Diagnosis not present

## 2017-02-23 DIAGNOSIS — C679 Malignant neoplasm of bladder, unspecified: Secondary | ICD-10-CM

## 2017-02-23 DIAGNOSIS — R110 Nausea: Secondary | ICD-10-CM

## 2017-02-23 DIAGNOSIS — I719 Aortic aneurysm of unspecified site, without rupture: Secondary | ICD-10-CM | POA: Diagnosis not present

## 2017-02-23 DIAGNOSIS — Z7982 Long term (current) use of aspirin: Secondary | ICD-10-CM

## 2017-02-23 DIAGNOSIS — Z79899 Other long term (current) drug therapy: Secondary | ICD-10-CM

## 2017-02-23 DIAGNOSIS — R5383 Other fatigue: Secondary | ICD-10-CM

## 2017-02-23 DIAGNOSIS — R5381 Other malaise: Secondary | ICD-10-CM | POA: Diagnosis not present

## 2017-02-23 DIAGNOSIS — Z880 Allergy status to penicillin: Secondary | ICD-10-CM

## 2017-02-23 LAB — CBC WITH DIFFERENTIAL/PLATELET
Basophils Absolute: 0 10*3/uL (ref 0–0.1)
Basophils Relative: 1 %
EOS PCT: 2 %
Eosinophils Absolute: 0.1 10*3/uL (ref 0–0.7)
HEMATOCRIT: 37.7 % — AB (ref 40.0–52.0)
Hemoglobin: 12.5 g/dL — ABNORMAL LOW (ref 13.0–18.0)
LYMPHS ABS: 0.9 10*3/uL — AB (ref 1.0–3.6)
LYMPHS PCT: 17 %
MCH: 30.5 pg (ref 26.0–34.0)
MCHC: 33.2 g/dL (ref 32.0–36.0)
MCV: 91.8 fL (ref 80.0–100.0)
MONO ABS: 0.4 10*3/uL (ref 0.2–1.0)
MONOS PCT: 8 %
NEUTROS ABS: 3.8 10*3/uL (ref 1.4–6.5)
Neutrophils Relative %: 72 %
PLATELETS: 102 10*3/uL — AB (ref 150–440)
RBC: 4.11 MIL/uL — ABNORMAL LOW (ref 4.40–5.90)
RDW: 15 % — AB (ref 11.5–14.5)
WBC: 5.2 10*3/uL (ref 3.8–10.6)

## 2017-02-23 LAB — COMPREHENSIVE METABOLIC PANEL
ALBUMIN: 3.6 g/dL (ref 3.5–5.0)
ALK PHOS: 62 U/L (ref 38–126)
ALT: 12 U/L — AB (ref 17–63)
AST: 15 U/L (ref 15–41)
Anion gap: 6 (ref 5–15)
BILIRUBIN TOTAL: 0.6 mg/dL (ref 0.3–1.2)
BUN: 20 mg/dL (ref 6–20)
CALCIUM: 8.7 mg/dL — AB (ref 8.9–10.3)
CO2: 27 mmol/L (ref 22–32)
CREATININE: 0.81 mg/dL (ref 0.61–1.24)
Chloride: 104 mmol/L (ref 101–111)
GFR calc Af Amer: >60 mL/min (ref >60)
GFR calc non Af Amer: >60 mL/min (ref >60)
GLUCOSE: 123 mg/dL — AB (ref 65–99)
Potassium: 4.1 mmol/L (ref 3.5–5.1)
Sodium: 137 mmol/L (ref 135–145)
TOTAL PROTEIN: 6.3 g/dL — AB (ref 6.5–8.1)

## 2017-02-23 MED ORDER — ACETAMINOPHEN 325 MG PO TABS
650.0000 mg | ORAL_TABLET | Freq: Once | ORAL | Status: AC
  Administered 2017-02-23: 650 mg via ORAL

## 2017-02-23 MED ORDER — ACETAMINOPHEN 325 MG PO TABS
ORAL_TABLET | ORAL | Status: AC
  Filled 2017-02-23: qty 2

## 2017-02-23 MED ORDER — GEMCITABINE HCL CHEMO INJECTION 1 GM/26.3ML
1800.0000 mg | Freq: Once | INTRAVENOUS | Status: AC
  Administered 2017-02-23: 1800 mg via INTRAVENOUS
  Filled 2017-02-23: qty 26.3

## 2017-02-23 MED ORDER — SODIUM CHLORIDE 0.9 % IV SOLN
Freq: Once | INTRAVENOUS | Status: AC
Start: 2017-02-23 — End: 2017-02-23
  Administered 2017-02-23: 11:00:00 via INTRAVENOUS
  Filled 2017-02-23: qty 1000

## 2017-02-23 MED ORDER — PROCHLORPERAZINE MALEATE 10 MG PO TABS
10.0000 mg | ORAL_TABLET | Freq: Once | ORAL | Status: AC
  Administered 2017-02-23: 10 mg via ORAL
  Filled 2017-02-23: qty 1

## 2017-02-23 MED ORDER — HEPARIN SOD (PORK) LOCK FLUSH 100 UNIT/ML IV SOLN
500.0000 [IU] | Freq: Once | INTRAVENOUS | Status: AC
  Administered 2017-02-23: 500 [IU] via INTRAVENOUS

## 2017-02-23 NOTE — Progress Notes (Signed)
Patient is here for follow up with labs and treatment today with Gemzar. He states that the last chemo treatment really threw him for a loop with nausea and diarrhea, and just feeling awful. He is feeling better now, but it took him a week and a half to get over the last treatment. Two and a half weeks ago, he quit smoking.

## 2017-02-28 NOTE — Progress Notes (Signed)
Gabriel Robbins  Telephone:(336) [phone] Fax:(336) [phone]  ID: Gabriel Robbins OB: 28-Jan-1946  MR#: [id number]  UUV#:[id number]  Patient Care Team: [name], NP as PCP - General (Family Medicine)  CHIEF COMPLAINT: Stage IIIA high-grade urothelial carcinoma.  INTERVAL HISTORY: Patient returns to clinic today for further evaluation and consideration of cycle 4, day 15 of cisplatin and gemcitabine.  Gemcitabine only today.  He has mild swelling of his left lower extremity, but otherwise feels well.  He did not report any nausea or diarrhea this week. He currently feels well and is asymptomatic. He has intermittent weakness and fatigue. He has no neurologic complaints. He denies any recent fevers or illnesses. He has a good appetite and denies weight loss. He has no chest pain or shortness of breath. He has no further urinary complaints. Patient offers no further specific complaints today.  REVIEW OF SYSTEMS:   Review of Systems  Constitutional: Negative.  Negative for fever, malaise/fatigue and weight loss.  Respiratory: Negative.  Negative for shortness of breath.   Cardiovascular: Positive for leg swelling. Negative for chest pain.  Gastrointestinal: Negative.  Negative for abdominal pain, diarrhea and nausea.  Genitourinary: Negative.  Negative for hematuria.  Musculoskeletal: Negative.   Skin: Negative.  Negative for rash.  Neurological: Negative.  Negative for weakness.  Psychiatric/Behavioral: Negative.  The patient is not nervous/anxious.     As per HPI. Otherwise, a complete review of systems is negative.  PAST MEDICAL HISTORY: Past Medical History:  Diagnosis Date  . Allergy   . Coronary artery disease   . Depression   . Hypertension   . Myocardial infarction ([place])   . Prostate cancer ([place])     PAST SURGICAL HISTORY: Past Surgical History:  Procedure Laterality Date  . BLADDER SURGERY    . CARDIAC SURGERY      FAMILY HISTORY: Family  History  Problem Relation Age of Onset  . Thyroid disease Mother   . Heart attack Father   . Heart disease Brother   . Parkinson's disease Brother     ADVANCED DIRECTIVES (Y/N):  N  HEALTH MAINTENANCE: Social History   Tobacco Use  . Smoking status: Former Smoker    Packs/day: 0.50    Years: 50.00    Pack years: 25.00  . Smokeless tobacco: Never Used  Substance Use Topics  . Alcohol use: Yes    Comment: very rare  . Drug use: No     Colonoscopy:  PAP:  Bone density:  Lipid panel:  Allergies  Allergen Reactions  . Penicillin G Other (See Comments)    Current Outpatient Medications  Medication Sig Dispense Refill  . acetaminophen (TYLENOL) 325 MG tablet Take 650 mg by mouth 2 (two) times daily as needed for mild pain or moderate pain.    Marland Kitchen albuterol (PROVENTIL HFA;VENTOLIN HFA) 108 (90 Base) MCG/ACT inhaler Inhale 2 puffs into the lungs every 4 (four) hours as needed for wheezing or shortness of breath.    Marland Kitchen ammonium lactate (LAC-HYDRIN) 12 % lotion Apply 1 application topically daily as needed for dry skin.    Marland Kitchen atorvastatin (LIPITOR) 40 MG tablet Take 80 mg by mouth at bedtime.     . carvedilol (COREG) 25 MG tablet Take 25 mg by mouth 2 (two) times daily with a meal.     . cetirizine (ZYRTEC) 10 MG tablet Take 10 mg by mouth daily.    . diclofenac sodium (VOLTAREN) 1 % GEL Apply 1 application topically daily as needed.    Marland Kitchen  fluticasone (FLONASE) 50 MCG/ACT nasal spray Place 2 sprays into both nostrils daily as needed for allergies or rhinitis.    . polyvinyl alcohol (LIQUIFILM TEARS) 1.4 % ophthalmic solution Place 1 drop into both eyes daily as needed for dry eyes.    . promethazine (PHENERGAN) 50 MG tablet Take 50 mg by mouth every 6 (six) hours as needed for nausea or vomiting.    . sertraline (ZOLOFT) 25 MG tablet Take 25 mg by mouth at bedtime.    . tamsulosin (FLOMAX) 0.4 MG CAPS capsule Take 0.4 mg by mouth daily.     . Tiotropium Bromide-Olodaterol (STIOLTO  RESPIMAT) 2.5-2.5 MCG/ACT AERS Inhale 2 puffs into the lungs every morning.    . vitamin B-12 (CYANOCOBALAMIN) 1000 MCG tablet Take 1,000 mcg by mouth daily.    Marland Kitchen zolpidem (AMBIEN) 5 MG tablet Take 2.5 mg by mouth at bedtime as needed for sleep (VERY RARELY TAKES).     No current facility-administered medications for this visit.    Facility-Administered Medications Ordered in Other Visits  Medication Dose Route Frequency Provider Last Rate Last Dose  . heparin lock flush 100 unit/mL  500 Units Intravenous Once [name], MD      . sodium chloride flush (NS) 0.9 % injection 10 mL  10 mL Intravenous PRN [name], MD   10 mL at 02/02/17 0851    OBJECTIVE: Vitals:   03/02/17 0953  BP: (!) 132/91  Pulse: 69  Resp: 18  Temp: 98.1 F (36.7 C)     Body mass index is 31.22 kg/m.    ECOG FS:0 - Asymptomatic  General: Well-develop  ed, well-nourished, no acute distress. Eyes: Pink conjunctiva, anicteric sclera. Lungs: Clear to auscultation bilaterally. Heart: Regular rate and rhythm. No rubs, murmurs, or gallops. Abdomen: Soft, nontender, nondistended. No organomegaly noted, normoactive bowel sounds. Musculoskeletal: Mild, 1+ edema of left lower extremity.   Neuro: Alert, answering all questions appropriately. Cranial nerves grossly intact. Skin: No rashes or petechiae noted. Psych: Normal affect.   LAB RESULTS:  Lab Results  Component Value Date   NA 136 03/02/2017   K 4.0 03/02/2017   CL 104 03/02/2017   CO2 25 03/02/2017   GLUCOSE 129 (H) 03/02/2017   BUN 20 03/02/2017   CREATININE 0.89 03/02/2017   CALCIUM 8.6 (L) 03/02/2017   PROT 6.6 03/02/2017   ALBUMIN 3.6 03/02/2017   AST 17 03/02/2017   ALT 12 (L) 03/02/2017   ALKPHOS 65 03/02/2017   BILITOT 0.5 03/02/2017   GFRNONAA >60 03/02/2017   GFRAA >60 03/02/2017    Lab Results  Component Value Date   WBC 1.8 (L) 03/02/2017   NEUTROABS 0.5 (L) 03/02/2017   HGB 12.4 (L) 03/02/2017   HCT 36.4 (L)  03/02/2017   MCV 89.8 03/02/2017   PLT 109 (L) 03/02/2017     STUDIES: No results found.  ASSESSMENT: Stage IIIA high-grade urothelial carcinoma.  PLAN:    1. Stage IIIA high-grade urothelial carcinoma: Outside CT scans and biopsy results reviewed independently confirming diagnosis and stage. PET scan completed at the [place].  Patient has completed 3 cycles of cisplatin and gemcitabine, but given suspicion of residual disease as well as wishing to delay surgery until 2019 will give one additional cycle. He will then proceed with complete cystectomy. Patient will continue with cisplatin and dose reduced gemcitabine on day 1 followed by gemcitabine only on days 8 and day 15. This is a 28 day cycle. Proceed with cycle 4, day 15  of gemcitabine only today despite neutropenia.  Patient has now completed his neoadjuvant chemotherapy and will have surgery at the [place] in January 2019.  Return to clinic in approximately 2 months postoperatively for further evaluation.  2.  Thrombocytopenia: Proceed with treatment today.  Dose reduced gemcitabine as above.  3.  Neutropenia: Seed with treatment today as above. 4.  Aortic aneurysm:Patient plans to have her repair in early December after Thanksgiving. 5.  Smoking cessation: Patient states he has now quit cigarette smoking. 6.  Nausea/diarrhea: Possibly related to chemotherapy, but is now resolved.  Monitor. 7.  Left lower extremity swelling: Will get ultrasound to rule out DVT.   Patient expressed understanding and was in agreement with this plan. He also understands that He can call clinic at any time with any questions, concerns, or complaints.   Cancer Staging Bladder cancer [facility]) Staging form: Urinary Bladder, AJCC 8th Edition - Clinical stage from 09/04/2016: Stage IIIA (cT4a, cN0, cM0) - Signed by [name], MD on 09/04/2016   [name], MD   03/02/2017 2:25 PM

## 2017-03-02 ENCOUNTER — Inpatient Hospital Stay: Payer: Non-veteran care

## 2017-03-02 ENCOUNTER — Inpatient Hospital Stay (HOSPITAL_BASED_OUTPATIENT_CLINIC_OR_DEPARTMENT_OTHER): Payer: Non-veteran care | Admitting: Oncology

## 2017-03-02 ENCOUNTER — Inpatient Hospital Stay: Payer: Non-veteran care | Attending: Oncology

## 2017-03-02 ENCOUNTER — Ambulatory Visit
Admission: RE | Admit: 2017-03-02 | Discharge: 2017-03-02 | Disposition: A | Discharge status: Home / Self Care | Payer: Non-veteran care | Source: Ambulatory Visit | Attending: Oncology | Admitting: Oncology

## 2017-03-02 ENCOUNTER — Telehealth: Payer: Self-pay | Admitting: *Deleted

## 2017-03-02 VITALS — BP 132/91 | HR 69 | Temp 98.1°F | Resp 18 | Wt 230.2 lb

## 2017-03-02 DIAGNOSIS — I2510 Atherosclerotic heart disease of native coronary artery without angina pectoris: Secondary | ICD-10-CM | POA: Insufficient documentation

## 2017-03-02 DIAGNOSIS — Z880 Allergy status to penicillin: Secondary | ICD-10-CM | POA: Diagnosis not present

## 2017-03-02 DIAGNOSIS — D696 Thrombocytopenia, unspecified: Secondary | ICD-10-CM | POA: Diagnosis not present

## 2017-03-02 DIAGNOSIS — I252 Old myocardial infarction: Secondary | ICD-10-CM | POA: Diagnosis not present

## 2017-03-02 DIAGNOSIS — R5383 Other fatigue: Secondary | ICD-10-CM

## 2017-03-02 DIAGNOSIS — D709 Neutropenia, unspecified: Secondary | ICD-10-CM | POA: Diagnosis not present

## 2017-03-02 DIAGNOSIS — C674 Malignant neoplasm of posterior wall of bladder: Secondary | ICD-10-CM | POA: Diagnosis not present

## 2017-03-02 DIAGNOSIS — M7989 Other specified soft tissue disorders: Secondary | ICD-10-CM | POA: Diagnosis present

## 2017-03-02 DIAGNOSIS — I10 Essential (primary) hypertension: Secondary | ICD-10-CM | POA: Diagnosis not present

## 2017-03-02 DIAGNOSIS — Z87891 Personal history of nicotine dependence: Secondary | ICD-10-CM | POA: Diagnosis not present

## 2017-03-02 DIAGNOSIS — Z8546 Personal history of malignant neoplasm of prostate: Secondary | ICD-10-CM | POA: Insufficient documentation

## 2017-03-02 DIAGNOSIS — I719 Aortic aneurysm of unspecified site, without rupture: Secondary | ICD-10-CM | POA: Insufficient documentation

## 2017-03-02 DIAGNOSIS — R531 Weakness: Secondary | ICD-10-CM | POA: Insufficient documentation

## 2017-03-02 DIAGNOSIS — C679 Malignant neoplasm of bladder, unspecified: Secondary | ICD-10-CM

## 2017-03-02 DIAGNOSIS — Z79899 Other long term (current) drug therapy: Secondary | ICD-10-CM

## 2017-03-02 DIAGNOSIS — Z5111 Encounter for antineoplastic chemotherapy: Secondary | ICD-10-CM | POA: Insufficient documentation

## 2017-03-02 DIAGNOSIS — R600 Localized edema: Secondary | ICD-10-CM | POA: Diagnosis not present

## 2017-03-02 LAB — CBC WITH DIFFERENTIAL/PLATELET
BASOS ABS: 0 10*3/uL (ref 0–0.1)
BASOS PCT: 1 %
EOS ABS: 0 10*3/uL (ref 0–0.7)
Eosinophils Relative: 1 %
HCT: 36.4 % — ABNORMAL LOW (ref 40.0–52.0)
HEMOGLOBIN: 12.4 g/dL — AB (ref 13.0–18.0)
Lymphocytes Relative: 50 %
Lymphs Abs: 0.9 10*3/uL — ABNORMAL LOW (ref 1.0–3.6)
MCH: 30.5 pg (ref 26.0–34.0)
MCHC: 34 g/dL (ref 32.0–36.0)
MCV: 89.8 fL (ref 80.0–100.0)
Monocytes Absolute: 0.3 10*3/uL (ref 0.2–1.0)
Monocytes Relative: 18 %
NEUTROS PCT: 30 %
Neutro Abs: 0.5 10*3/uL — ABNORMAL LOW (ref 1.4–6.5)
Platelets: 109 10*3/uL — ABNORMAL LOW (ref 150–440)
RBC: 4.05 MIL/uL — AB (ref 4.40–5.90)
RDW: 14.9 % — ABNORMAL HIGH (ref 11.5–14.5)
WBC: 1.8 10*3/uL — AB (ref 3.8–10.6)

## 2017-03-02 LAB — COMPREHENSIVE METABOLIC PANEL
ALK PHOS: 65 U/L (ref 38–126)
ALT: 12 U/L — AB (ref 17–63)
AST: 17 U/L (ref 15–41)
Albumin: 3.6 g/dL (ref 3.5–5.0)
Anion gap: 7 (ref 5–15)
BUN: 20 mg/dL (ref 6–20)
CALCIUM: 8.6 mg/dL — AB (ref 8.9–10.3)
CO2: 25 mmol/L (ref 22–32)
CREATININE: 0.89 mg/dL (ref 0.61–1.24)
Chloride: 104 mmol/L (ref 101–111)
Glucose, Bld: 129 mg/dL — ABNORMAL HIGH (ref 65–99)
Potassium: 4 mmol/L (ref 3.5–5.1)
Sodium: 136 mmol/L (ref 135–145)
Total Bilirubin: 0.5 mg/dL (ref 0.3–1.2)
Total Protein: 6.6 g/dL (ref 6.5–8.1)

## 2017-03-02 MED ORDER — HEPARIN SOD (PORK) LOCK FLUSH 100 UNIT/ML IV SOLN
INTRAVENOUS | Status: AC
  Filled 2017-03-02: qty 5

## 2017-03-02 MED ORDER — SODIUM CHLORIDE 0.9 % IV SOLN
1800.0000 mg | Freq: Once | INTRAVENOUS | Status: AC
  Administered 2017-03-02: 1800 mg via INTRAVENOUS
  Filled 2017-03-02: qty 21.04

## 2017-03-02 MED ORDER — PROCHLORPERAZINE MALEATE 10 MG PO TABS
10.0000 mg | ORAL_TABLET | Freq: Once | ORAL | Status: AC
  Administered 2017-03-02: 10 mg via ORAL
  Filled 2017-03-02: qty 1

## 2017-03-02 MED ORDER — SODIUM CHLORIDE 0.9% FLUSH
10.0000 mL | INTRAVENOUS | Status: DC | PRN
  Administered 2017-03-02: 10 mL via INTRAVENOUS
  Filled 2017-03-02: qty 10

## 2017-03-02 MED ORDER — HEPARIN SOD (PORK) LOCK FLUSH 100 UNIT/ML IV SOLN
500.0000 [IU] | Freq: Once | INTRAVENOUS | Status: AC
  Administered 2017-03-02: 500 [IU] via INTRAVENOUS
  Filled 2017-03-02: qty 5

## 2017-03-02 MED ORDER — SODIUM CHLORIDE 0.9 % IV SOLN
Freq: Once | INTRAVENOUS | Status: AC
  Administered 2017-03-02: 11:00:00 via INTRAVENOUS
  Filled 2017-03-02: qty 1000

## 2017-03-02 NOTE — Telephone Encounter (Signed)
[place] called report that patient is neg for clot in his left leg

## 2017-03-02 NOTE — Progress Notes (Signed)
Reviewed labs and patient to receive treatment/Gemzar today per Dr. Grayland Ormond.

## 2017-03-07 NOTE — Telephone Encounter (Signed)
Patient notified of results and voiced understanding. 

## 2017-03-26 DIAGNOSIS — I714 Abdominal aortic aneurysm, without rupture, unspecified: Secondary | ICD-10-CM

## 2017-03-26 HISTORY — DX: Abdominal aortic aneurysm, without rupture: I71.4

## 2017-03-26 HISTORY — DX: Abdominal aortic aneurysm, without rupture, unspecified: I71.40

## 2017-05-01 NOTE — Progress Notes (Signed)
[name]  Telephone:(336) [phone] Fax:(336) [phone]  ID: Gabriel Robbins OB: [birth year]  MR#: [id number]  DVV#:[id number]  Patient Care Team: [name], NP as PCP - General (Family Medicine)  CHIEF COMPLAINT: Stage IIIA high-grade urothelial carcinoma.  INTERVAL HISTORY: Patient returns to clinic today for repeat laboratory work and further evaluation.  He has completed his aortic graft and for his aneurysm, but has yet to undergo cystectomy as planned.  He currently feels well and is asymptomatic.  He denies any nausea, vomiting, constipation, or diarrhea.  He does not complain of weakness or fatigue today.  He has no neurologic complaints. He denies any recent fevers or illnesses.  He denies any chest pain or shortness of breath.  He has a good appetite and denies weight loss. He has no further urinary complaints. Patient offers no specific complaints today.  REVIEW OF SYSTEMS:   Review of Systems  Constitutional: Negative.  Negative for fever, malaise/fatigue and weight loss.  Respiratory: Negative.  Negative for shortness of breath.   Cardiovascular: Negative.  Negative for chest pain and leg swelling.  Gastrointestinal: Negative.  Negative for abdominal pain, diarrhea and nausea.  Genitourinary: Negative.  Negative for hematuria.  Musculoskeletal: Negative.   Skin: Negative.  Negative for rash.  Neurological: Negative.  Negative for weakness.  Psychiatric/Behavioral: Negative.  The patient is not nervous/anxious.     As per HPI. Otherwise, a complete review of systems is negative.  PAST MEDICAL HISTORY: Past Medical History:  Diagnosis Date  . Allergy   . Coronary artery disease   . Depression   . Hypertension   . Myocardial infarction ([place])   . Prostate cancer ([place])     PAST SURGICAL HISTORY: Past Surgical History:  Procedure Laterality Date  . BLADDER SURGERY    . CARDIAC SURGERY    . PORTA CATH INSERTION N/A 09/22/2016   Procedure:  Glori Luis Cath Insertion;  Surgeon: Algernon Huxley, MD;  Location: [place] [facility];  Service: Cardiovascular;  Laterality: N/A;    FAMILY HISTORY: Family History  Problem Relation Age of Onset  . Thyroid disease Mother   . Heart attack Father   . Heart disease Brother   . Parkinson's disease Brother     ADVANCED DIRECTIVES (Y/N):  N  HEALTH MAINTENANCE: Social History   Tobacco Use  . Smoking status: Former Smoker    Packs/day: 0.50    Years: 50.00    Pack years: 25.00  . Smokeless tobacco: Never Used  Substance Use Topics  . Alcohol use: Yes    Comment: very rare  . Drug use: No     Colonoscopy:  PAP:  Bone density:  Lipid panel:  Allergies  Allergen Reactions  . Penicillin G Other (See Comments)    Current Outpatient Medications  Medication Sig Dispense Refill  . acetaminophen (TYLENOL) 325 MG tablet Take 650 mg by mouth 2 (two) times daily as needed for mild pain or moderate pain.    Marland Kitchen albuterol (PROVENTIL HFA;VENTOLIN HFA) 108 (90 Base) MCG/ACT inhaler Inhale 2 puffs into the lungs every 4 (four) hours as needed for wheezing or shortness of breath.    Marland Kitchen ammonium lactate (LAC-HYDRIN) 12 % lotion Apply 1 application topically daily as needed for dry skin.    Marland Kitchen atorvastatin (LIPITOR) 40 MG tablet Take 80 mg by mouth at bedtime.     . carvedilol (COREG) 25 MG tablet Take 25 mg by mouth 2 (two) times daily with a meal.     .  cetirizine (ZYRTEC) 10 MG tablet Take 10 mg by mouth daily.    . diclofenac sodium (VOLTAREN) 1 % GEL Apply 1 application topically daily as needed.    . fluticasone (FLONASE) 50 MCG/ACT nasal spray Place 2 sprays into both nostrils daily as needed for allergies or rhinitis.    . polyvinyl alcohol (LIQUIFILM TEARS) 1.4 % ophthalmic solution Place 1 drop into both eyes daily as needed for dry eyes.    . promethazine (PHENERGAN) 50 MG tablet Take 50 mg by mouth every 6 (six) hours as needed for nausea or vomiting.    . sertraline (ZOLOFT) 25 MG  tablet Take 25 mg by mouth at bedtime.    . tamsulosin (FLOMAX) 0.4 MG CAPS capsule Take 0.4 mg by mouth daily.     . Tiotropium Bromide-Olodaterol (STIOLTO RESPIMAT) 2.5-2.5 MCG/ACT AERS Inhale 2 puffs into the lungs every morning.    . vitamin B-12 (CYANOCOBALAMIN) 1000 MCG tablet Take 1,000 mcg by mouth daily.    Marland Kitchen zolpidem (AMBIEN) 5 MG tablet Take 2.5 mg by mouth at bedtime as needed for sleep (VERY RARELY TAKES).     No current facility-administered medications for this visit.    Facility-Administered Medications Ordered in Other Visits  Medication Dose Route Frequency Provider Last Rate Last Dose  . heparin lock flush 100 unit/mL  500 Units Intravenous Once [name], MD      . sodium chloride flush (NS) 0.9 % injection 10 mL  10 mL Intravenous PRN [name], MD   10 mL at 02/02/17 0851    OBJECTIVE: Vitals:   05/03/17 1516  BP: 134/82  Pulse: 90  Resp: 18  Temp: 98.4 F (36.9 C)     Body mass index is 32.39 kg/m.    ECOG FS:0 - Asymptomatic  General: Well-develop  ed, well-nourished, no acute distress. Eyes: Pink conjunctiva, anicteric sclera. Lungs: Clear to auscultation bilaterally. Heart: Regular rate and rhythm. No rubs, murmurs, or gallops. Abdomen: Soft, nontender, nondistended. No organomegaly noted, normoactive bowel sounds. Musculoskeletal: Mild, 1+ edema of left lower extremity.   Neuro: Alert, answering all questions appropriately. Cranial nerves grossly intact. Skin: No rashes or petechiae noted. Psych: Normal affect.   LAB RESULTS:  Lab Results  Component Value Date   NA 135 05/03/2017   K 4.0 05/03/2017   CL 102 05/03/2017   CO2 24 05/03/2017   GLUCOSE 125 (H) 05/03/2017   BUN 21 (H) 05/03/2017   CREATININE 0.86 05/03/2017   CALCIUM 8.7 (L) 05/03/2017   PROT 6.9 05/03/2017   ALBUMIN 3.5 05/03/2017   AST 22 05/03/2017   ALT 15 (L) 05/03/2017   ALKPHOS 82 05/03/2017   BILITOT 0.8 05/03/2017   GFRNONAA >60 05/03/2017    GFRAA >60 05/03/2017    Lab Results  Component Value Date   WBC 6.4 05/03/2017   NEUTROABS 4.5 05/03/2017   HGB 11.6 (L) 05/03/2017   HCT 35.9 (L) 05/03/2017   MCV 85.8 05/03/2017   PLT 195 05/03/2017     STUDIES: No results found.  ASSESSMENT: Stage IIIA high-grade urothelial carcinoma.  PLAN:    1. Stage IIIA high-grade urothelial carcinoma: Outside CT scans and biopsy results reviewed independently confirming diagnosis and stage. PET scan completed at the [place]. patient initially completed 3 cycles of cisplatin and gemcitabine, but given suspicion of residual disease and delaying his surgery he received a fourth cycle completing on March 02, 2017.  Plan was to do a total cystectomy in January 2019, but this  has not been scheduled yet.  Will get a CT scan in the next 1-2 weeks for further evaluation.  Patient will then return to clinic in 3 months for further evaluation.    2.  Thrombocytopenia: Resolved. 3.  Neutropenia: Resolved. 4.  Aortic aneurysm: Patient recently completed his repair and grafting in December 2018. 5.  Smoking cessation: Patient states he has now quit cigarette smoking.    Patient expressed understanding and was in agreement with this plan. He also understands that He can call clinic at any time with any questions, concerns, or complaints.   Cancer Staging Bladder cancer [facility]) Staging form: Urinary Bladder, AJCC 8th Edition - Clinical stage from 09/04/2016: Stage IIIA (cT4a, cN0, cM0) - Signed by [name], MD on 09/04/2016   [name], MD   05/07/2017 10:11 AM

## 2017-05-03 ENCOUNTER — Inpatient Hospital Stay: Payer: Non-veteran care | Attending: Oncology

## 2017-05-03 ENCOUNTER — Inpatient Hospital Stay (HOSPITAL_BASED_OUTPATIENT_CLINIC_OR_DEPARTMENT_OTHER): Payer: Non-veteran care | Admitting: Oncology

## 2017-05-03 VITALS — BP 134/82 | HR 90 | Temp 98.4°F | Resp 18 | Wt 238.8 lb

## 2017-05-03 DIAGNOSIS — C679 Malignant neoplasm of bladder, unspecified: Secondary | ICD-10-CM | POA: Diagnosis not present

## 2017-05-03 DIAGNOSIS — C674 Malignant neoplasm of posterior wall of bladder: Secondary | ICD-10-CM

## 2017-05-03 DIAGNOSIS — Z9221 Personal history of antineoplastic chemotherapy: Secondary | ICD-10-CM | POA: Diagnosis not present

## 2017-05-03 DIAGNOSIS — F17211 Nicotine dependence, cigarettes, in remission: Secondary | ICD-10-CM | POA: Diagnosis not present

## 2017-05-03 DIAGNOSIS — I719 Aortic aneurysm of unspecified site, without rupture: Secondary | ICD-10-CM | POA: Insufficient documentation

## 2017-05-03 LAB — COMPREHENSIVE METABOLIC PANEL
ALT: 15 U/L — AB (ref 17–63)
ANION GAP: 9 (ref 5–15)
AST: 22 U/L (ref 15–41)
Albumin: 3.5 g/dL (ref 3.5–5.0)
Alkaline Phosphatase: 82 U/L (ref 38–126)
BUN: 21 mg/dL — ABNORMAL HIGH (ref 6–20)
CO2: 24 mmol/L (ref 22–32)
Calcium: 8.7 mg/dL — ABNORMAL LOW (ref 8.9–10.3)
Chloride: 102 mmol/L (ref 101–111)
Creatinine, Ser: 0.86 mg/dL (ref 0.61–1.24)
GFR calc non Af Amer: >60 mL/min (ref >60)
Glucose, Bld: 125 mg/dL — ABNORMAL HIGH (ref 65–99)
POTASSIUM: 4 mmol/L (ref 3.5–5.1)
SODIUM: 135 mmol/L (ref 135–145)
Total Bilirubin: 0.8 mg/dL (ref 0.3–1.2)
Total Protein: 6.9 g/dL (ref 6.5–8.1)

## 2017-05-03 LAB — CBC WITH DIFFERENTIAL/PLATELET
Basophils Absolute: 0 10*3/uL (ref 0–0.1)
Basophils Relative: 1 %
Eosinophils Absolute: 0.3 10*3/uL (ref 0–0.7)
Eosinophils Relative: 5 %
HEMATOCRIT: 35.9 % — AB (ref 40.0–52.0)
HEMOGLOBIN: 11.6 g/dL — AB (ref 13.0–18.0)
LYMPHS ABS: 0.8 10*3/uL — AB (ref 1.0–3.6)
LYMPHS PCT: 13 %
MCH: 27.8 pg (ref 26.0–34.0)
MCHC: 32.3 g/dL (ref 32.0–36.0)
MCV: 85.8 fL (ref 80.0–100.0)
MONOS PCT: 11 %
Monocytes Absolute: 0.7 10*3/uL (ref 0.2–1.0)
NEUTROS ABS: 4.5 10*3/uL (ref 1.4–6.5)
NEUTROS PCT: 70 %
Platelets: 195 10*3/uL (ref 150–440)
RBC: 4.18 MIL/uL — AB (ref 4.40–5.90)
RDW: 15.9 % — ABNORMAL HIGH (ref 11.5–14.5)
WBC: 6.4 10*3/uL (ref 3.8–10.6)

## 2017-05-05 ENCOUNTER — Other Ambulatory Visit: Payer: Self-pay | Admitting: *Deleted

## 2017-05-05 ENCOUNTER — Telehealth: Payer: Self-pay | Admitting: *Deleted

## 2017-05-05 DIAGNOSIS — C679 Malignant neoplasm of bladder, unspecified: Secondary | ICD-10-CM

## 2017-05-05 NOTE — Telephone Encounter (Signed)
Done. Thanks.

## 2017-05-05 NOTE — Telephone Encounter (Signed)
That's fine. Thanks!

## 2017-05-05 NOTE — Telephone Encounter (Signed)
Pt called in to request having CT chest in addition to CT abd/pelvis that is scheduled to be done. Pt saw his urologist at the [facility] and they are requesting the ct chest be done in addition to abd/pelvis. Please advise.

## 2017-05-09 ENCOUNTER — Telehealth: Payer: Self-pay

## 2017-05-09 NOTE — Telephone Encounter (Signed)
Chest CT is fine.  Thanks.

## 2017-05-09 NOTE — Telephone Encounter (Signed)
I left pt a VM to contact me so we can coordinate his MRI appt with CT appt.

## 2017-05-09 NOTE — Telephone Encounter (Signed)
Patient called and left message that his Urologist at the [place] would like for him to have a CT chest along with the CT abdomen and pelvis. He would like to have the CT chest done the day before or the day after since he has an MRI scheduled the same day as the abdomen and pelvis. Also patient would like to know when he needs to pick up his prep kit.

## 2017-05-24 ENCOUNTER — Ambulatory Visit: Admission: RE | Admit: 2017-05-24 | Payer: Non-veteran care | Source: Ambulatory Visit

## 2017-05-25 ENCOUNTER — Ambulatory Visit: Payer: Medicare Other

## 2017-06-07 ENCOUNTER — Inpatient Hospital Stay: Payer: Non-veteran care | Attending: Oncology

## 2017-06-07 DIAGNOSIS — Z452 Encounter for adjustment and management of vascular access device: Secondary | ICD-10-CM | POA: Insufficient documentation

## 2017-06-07 DIAGNOSIS — C679 Malignant neoplasm of bladder, unspecified: Secondary | ICD-10-CM | POA: Insufficient documentation

## 2017-06-07 DIAGNOSIS — Z95828 Presence of other vascular implants and grafts: Secondary | ICD-10-CM

## 2017-06-07 MED ORDER — SODIUM CHLORIDE 0.9% FLUSH
10.0000 mL | INTRAVENOUS | Status: AC | PRN
  Administered 2017-06-07: 10 mL
  Filled 2017-06-07: qty 10

## 2017-06-07 MED ORDER — HEPARIN SOD (PORK) LOCK FLUSH 100 UNIT/ML IV SOLN
500.0000 [IU] | INTRAVENOUS | Status: AC | PRN
  Administered 2017-06-07: 500 [IU]

## 2017-06-09 ENCOUNTER — Ambulatory Visit
Admission: RE | Admit: 2017-06-09 | Discharge: 2017-06-09 | Disposition: A | Discharge status: Home / Self Care | Payer: Non-veteran care | Source: Ambulatory Visit | Attending: Oncology | Admitting: Oncology

## 2017-06-09 DIAGNOSIS — C679 Malignant neoplasm of bladder, unspecified: Secondary | ICD-10-CM | POA: Insufficient documentation

## 2017-06-09 DIAGNOSIS — N400 Enlarged prostate without lower urinary tract symptoms: Secondary | ICD-10-CM | POA: Insufficient documentation

## 2017-06-09 DIAGNOSIS — J439 Emphysema, unspecified: Secondary | ICD-10-CM | POA: Diagnosis not present

## 2017-06-09 DIAGNOSIS — I714 Abdominal aortic aneurysm, without rupture: Secondary | ICD-10-CM | POA: Diagnosis not present

## 2017-06-09 DIAGNOSIS — N281 Cyst of kidney, acquired: Secondary | ICD-10-CM | POA: Insufficient documentation

## 2017-06-09 MED ORDER — IOPAMIDOL (ISOVUE-300) INJECTION 61%
100.0000 mL | Freq: Once | INTRAVENOUS | Status: AC | PRN
  Administered 2017-06-09: 100 mL via INTRAVENOUS

## 2017-07-19 ENCOUNTER — Inpatient Hospital Stay: Payer: Non-veteran care | Attending: Oncology

## 2017-08-15 ENCOUNTER — Other Ambulatory Visit
Admission: RE | Admit: 2017-08-15 | Discharge: 2017-08-15 | Disposition: A | Discharge status: Home / Self Care | Payer: Non-veteran care | Source: Ambulatory Visit | Attending: Family Medicine | Admitting: Family Medicine

## 2017-08-15 DIAGNOSIS — I10 Essential (primary) hypertension: Secondary | ICD-10-CM | POA: Insufficient documentation

## 2017-08-15 DIAGNOSIS — A0471 Enterocolitis due to Clostridium difficile, recurrent: Secondary | ICD-10-CM | POA: Diagnosis present

## 2017-08-15 LAB — CBC WITH DIFFERENTIAL/PLATELET
BASOS ABS: 0.1 10*3/uL (ref 0–0.1)
BASOS PCT: 1 %
EOS ABS: 0 10*3/uL (ref 0–0.7)
Eosinophils Relative: 1 %
HEMATOCRIT: 33.5 % — AB (ref 40.0–52.0)
Hemoglobin: 10.9 g/dL — ABNORMAL LOW (ref 13.0–18.0)
Lymphocytes Relative: 10 %
Lymphs Abs: 0.7 10*3/uL — ABNORMAL LOW (ref 1.0–3.6)
MCH: 28.3 pg (ref 26.0–34.0)
MCHC: 32.5 g/dL (ref 32.0–36.0)
MCV: 87 fL (ref 80.0–100.0)
Monocytes Absolute: 0.9 10*3/uL (ref 0.2–1.0)
Monocytes Relative: 12 %
NEUTROS ABS: 5.7 10*3/uL (ref 1.4–6.5)
NEUTROS PCT: 76 %
Platelets: 341 10*3/uL (ref 150–440)
RBC: 3.85 MIL/uL — ABNORMAL LOW (ref 4.40–5.90)
RDW: 19.1 % — AB (ref 11.5–14.5)
WBC: 7.4 10*3/uL (ref 3.8–10.6)

## 2017-08-15 LAB — COMPREHENSIVE METABOLIC PANEL
ALBUMIN: 2.6 g/dL — AB (ref 3.5–5.0)
ALT: 16 U/L — ABNORMAL LOW (ref 17–63)
AST: 29 U/L (ref 15–41)
Alkaline Phosphatase: 70 U/L (ref 38–126)
Anion gap: 8 (ref 5–15)
BILIRUBIN TOTAL: 0.6 mg/dL (ref 0.3–1.2)
BUN: 13 mg/dL (ref 6–20)
CHLORIDE: 108 mmol/L (ref 101–111)
CO2: 21 mmol/L — AB (ref 22–32)
Calcium: 7.8 mg/dL — ABNORMAL LOW (ref 8.9–10.3)
Creatinine, Ser: 1.06 mg/dL (ref 0.61–1.24)
GFR calc Af Amer: >60 mL/min (ref >60)
GFR calc non Af Amer: >60 mL/min (ref >60)
GLUCOSE: 112 mg/dL — AB (ref 65–99)
POTASSIUM: 4.4 mmol/L (ref 3.5–5.1)
SODIUM: 137 mmol/L (ref 135–145)
Total Protein: 6.1 g/dL — ABNORMAL LOW (ref 6.5–8.1)

## 2017-08-15 NOTE — Progress Notes (Deleted)
[place]  [phone] [phone]  ID: Gabriel Robbins OB: 11-14-1945  MR#: [id number]  LNL#:[id number]  Patient Care Team: [name], NP as PCP - General (Family Medicine)  CHIEF COMPLAINT: Stage IIIA high-grade urothelial carcinoma.  INTERVAL HISTORY: Patient returns to clinic today for repeat laboratory work and further evaluation.  He has completed his aortic graft and for his aneurysm, but has yet to undergo cystectomy as planned.  He currently feels well and is asymptomatic.  He denies any nausea, vomiting, constipation, or diarrhea.  He does not complain of weakness or fatigue today.  He has no neurologic complaints. He denies any recent fevers or illnesses.  He denies any chest pain or shortness of breath.  He has a good appetite and denies weight loss. He has no further urinary complaints. Patient offers no specific complaints today.  REVIEW OF SYSTEMS:   Review of Systems  Constitutional: Negative.  Negative for fever, malaise/fatigue and weight loss.  Respiratory: Negative.  Negative for shortness of breath.   Cardiovascular: Negative.  Negative for chest pain and leg swelling.  Gastrointestinal: Negative.  Negative for abdominal pain, diarrhea and nausea.  Genitourinary: Negative.  Negative for hematuria.  Musculoskeletal: Negative.   Skin: Negative.  Negative for rash.  Neurological: Negative.  Negative for weakness.  Psychiatric/Behavioral: Negative.  The patient is not nervous/anxious.     As per HPI. Otherwise, a complete review of systems is negative.  PAST MEDICAL HISTORY: Past Medical History:  Diagnosis Date  . Allergy   . Coronary artery disease   . Depression   . Myocardial infarction ([place])   . Prostate cancer ([place])     PAST SURGICAL HISTORY: Past Surgical History:  Procedure Laterality Date  . BLADDER SURGERY    . CARDIAC SURGERY    . PORTA CATH INSERTION N/A 09/22/2016   Procedure: Glori Luis Cath  Insertion;  Surgeon: Algernon Huxley, MD;  Location: [facility];  Service: Cardiovascular;  Laterality: N/A;    FAMILY HISTORY: Family History  Problem Relation Age of Onset  . Thyroid disease Mother   . Heart attack Father   . Heart disease Brother   . Parkinson's disease Brother     ADVANCED DIRECTIVES (Y/N):  N  HEALTH MAINTENANCE: Social History   Tobacco Use  . Smoking status: Former Smoker    Packs/day: 0.50    Years: 50.00    Pack years: 25.00  . Smokeless tobacco: Never Used  Substance Use Topics  . Alcohol use: Yes    Comment: very rare  . Drug use: No     Colonoscopy:  PAP:  Bone density:  Lipid panel:  Allergies  Allergen Reactions  . Penicillin G Other (See Comments)    Current Outpatient Medications  Medication Sig Dispense Refill  . acetaminophen (TYLENOL) 325 MG tablet Take 650 mg by mouth 2 (two) times daily as needed for mild pain or moderate pain.    Marland Kitchen albuterol (PROVENTIL HFA;VENTOLIN HFA) 108 (90 Base) MCG/ACT inhaler Inhale 2 puffs into the lungs every 4 (four) hours as needed for wheezing or shortness of breath.    Marland Kitchen ammonium lactate (LAC-HYDRIN) 12 % lotion Apply 1 application topically daily as needed for dry skin.    Marland Kitchen atorvastatin (LIPITOR) 40 MG tablet Take 80 mg by mouth at bedtime.     . carvedilol (COREG) 25 MG tablet Take 25 mg by mouth 2 (two) times daily with a meal.     . cetirizine (ZYRTEC)  10 MG tablet Take 10 mg by mouth daily.    . diclofenac sodium (VOLTAREN) 1 % GEL Apply 1 application topically daily as needed.    . fluticasone (FLONASE) 50 MCG/ACT nasal spray Place 2 sprays into both nostrils daily as needed for allergies or rhinitis.    . polyvinyl alcohol (LIQUIFILM TEARS) 1.4 % ophthalmic solution Place 1 drop into both eyes daily as needed for dry eyes.    . promethazine (PHENERGAN) 50 MG tablet Take 50 mg by mouth every 6 (six) hours as needed for nausea or vomiting.    . sertraline (ZOLOFT) 25 MG tablet Take 25  mg by mouth at bedtime.    . tamsulosin (FLOMAX) 0.4 MG CAPS capsule Take 0.4 mg by mouth daily.     . Tiotropium Bromide-Olodaterol (STIOLTO RESPIMAT) 2.5-2.5 MCG/ACT AERS Inhale 2 puffs into the lungs every morning.    . vitamin B-12 (CYANOCOBALAMIN) 1000 MCG tablet Take 1,000 mcg by mouth daily.    Marland Kitchen zolpidem (AMBIEN) 5 MG tablet Take 2.5 mg by mouth at bedtime as needed for sleep (VERY RARELY TAKES).     No current facility-administered medications for this visit.    Facility-Administered Medications Ordered in Other Visits  Medication Dose Route Frequency Provider Last Rate Last Dose  . heparin lock flush 100 unit/mL  500 Units Intravenous Once [name], MD      . sodium chloride flush (NS) 0.9 % injection 10 mL  10 mL Intravenous PRN [name], MD   10 mL at 02/02/17 0851    OBJECTIVE: There were no vitals filed for this visit.   There is no height or weight on file to calculate BMI.    ECOG FS:0 - Asymptomatic  General: Well-develop  ed, well-nourished, no acute distress. Eyes: Pink conjunctiva, anicteric sclera. Lungs: Clear to auscultation bilaterally. Heart: Regular rate and rhythm. No rubs, murmurs, or gallops. Abdomen: Soft, nontender, nondistended. No organomegaly noted, normoactive bowel sounds. Musculoskeletal: Mild, 1+ edema of left lower extremity.   Neuro: Alert, answering all questions appropriately. Cranial nerves grossly intact. Skin: No rashes or petechiae noted. Psych: Normal affect.   LAB RESULTS:  Lab Results  Component Value Date   NA 135 05/03/2017   K 4.0 05/03/2017   CL 102 05/03/2017   CO2 24 05/03/2017   GLUCOSE 125 (H) 05/03/2017   BUN 21 (H) 05/03/2017   CREATININE 0.86 05/03/2017   CALCIUM 8.7 (L) 05/03/2017   PROT 6.9 05/03/2017   ALBUMIN 3.5 05/03/2017   AST 22 05/03/2017   ALT 15 (L) 05/03/2017   ALKPHOS 82 05/03/2017   BILITOT 0.8 05/03/2017   GFRNONAA >60 05/03/2017   GFRAA >60 05/03/2017    Lab Results    Component Value Date   WBC 6.4 05/03/2017   NEUTROABS 4.5 05/03/2017   HGB 11.6 (L) 05/03/2017   HCT 35.9 (L) 05/03/2017   MCV 85.8 05/03/2017   PLT 195 05/03/2017     STUDIES: No results found.  ASSESSMENT: Stage IIIA high-grade urothelial carcinoma.  PLAN:    1. Stage IIIA high-grade urothelial carcinoma: Outside CT scans and biopsy results reviewed independently confirming diagnosis and stage. PET scan completed at the [facility]. patient initially completed 3 cycles of cisplatin and gemcitabine, but given suspicion of residual disease and delaying his surgery he received a fourth cycle completing on March 02, 2017.  Plan was to do a total cystectomy in January 2019, but this has not been scheduled yet.  Will get a CT  scan in the next 1-2 weeks for further evaluation.  Patient will then return to clinic in 3 months for further evaluation.    2.  Thrombocytopenia: Resolved. 3.  Neutropenia: Resolved. 4.  Aortic aneurysm: Patient recently completed his repair and grafting in December 2018. 5.  Smoking cessation: Patient states he has now quit cigarette smoking.    Patient expressed understanding and was in agreement with this plan. He also understands that He can call clinic at any time with any questions, concerns, or complaints.   Cancer Staging Bladder cancer [facility]) Staging form: Urinary Bladder, AJCC 8th Edition - Clinical stage from 09/04/2016: Stage IIIA (cT4a, cN0, cM0) - Signed by [name], MD on 09/04/2016   [name], MD   08/15/2017 12:04 AM

## 2017-08-17 ENCOUNTER — Inpatient Hospital Stay: Payer: Non-veteran care | Admitting: Oncology

## 2017-08-17 ENCOUNTER — Inpatient Hospital Stay: Payer: Non-veteran care

## 2017-08-29 NOTE — Progress Notes (Deleted)
[place]  [phone] [phone]) [phone]  ID: Gabriel Robbins OB: [birth year]  MR#: [id number]  IOX#:[id number]  Patient Care Team: [name], NP as PCP - General (Family Medicine)  CHIEF COMPLAINT: Stage IIIA high-grade urothelial carcinoma.  INTERVAL HISTORY: Patient returns to clinic today for repeat laboratory work and further evaluation.  He has completed his aortic graft and for his aneurysm, but has yet to undergo cystectomy as planned.  He currently feels well and is asymptomatic.  He denies any nausea, vomiting, constipation, or diarrhea.  He does not complain of weakness or fatigue today.  He has no neurologic complaints. He denies any recent fevers or illnesses.  He denies any chest pain or shortness of breath.  He has a good appetite and denies weight loss. He has no further urinary complaints. Patient offers no specific complaints today.  REVIEW OF SYSTEMS:   Review of Systems  Constitutional: Negative.  Negative for fever, malaise/fatigue and weight loss.  Respiratory: Negative.  Negative for shortness of breath.   Cardiovascular: Negative.  Negative for chest pain and leg swelling.  Gastrointestinal: Negative.  Negative for abdominal pain, diarrhea and nausea.  Genitourinary: Negative.  Negative for hematuria.  Musculoskeletal: Negative.   Skin: Negative.  Negative for rash.  Neurological: Negative.  Negative for weakness.  Psychiatric/Behavioral: Negative.  The patient is not nervous/anxious.     As per HPI. Otherwise, a complete review of systems is negative.  PAST MEDICAL HISTORY: Past Medical History:  Diagnosis Date  . Allergy   . Coronary artery disease   . Depression   . Myocardial infarction ([place])   . Prostate cancer ([place])     PAST SURGICAL HISTORY: Past Surgical History:  Procedure Laterality Date  . BLADDER SURGERY    . CARDIAC SURGERY    . PORTA CATH INSERTION N/A 09/22/2016   Procedure: Glori Luis Cath  Insertion;  Surgeon: Algernon Huxley, MD;  Location: [facility];  Service: Cardiovascular;  Laterality: N/A;    FAMILY HISTORY: Family History  Problem Relation Age of Onset  . Thyroid disease Mother   . Heart attack Father   . Heart disease Brother   . Parkinson's disease Brother     ADVANCED DIRECTIVES (Y/N):  N  HEALTH MAINTENANCE: Social History   Tobacco Use  . Smoking status: Former Smoker    Packs/day: 0.50    Years: 50.00    Pack years: 25.00  . Smokeless tobacco: Never Used  Substance Use Topics  . Alcohol use: Yes    Comment: very rare  . Drug use: No     Colonoscopy:  PAP:  Bone density:  Lipid panel:  Allergies  Allergen Reactions  . Penicillin G Other (See Comments)    Current Outpatient Medications  Medication Sig Dispense Refill  . acetaminophen (TYLENOL) 325 MG tablet Take 650 mg by mouth 2 (two) times daily as needed for mild pain or moderate pain.    Marland Kitchen albuterol (PROVENTIL HFA;VENTOLIN HFA) 108 (90 Base) MCG/ACT inhaler Inhale 2 puffs into the lungs every 4 (four) hours as needed for wheezing or shortness of breath.    Marland Kitchen ammonium lactate (LAC-HYDRIN) 12 % lotion Apply 1 application topically daily as needed for dry skin.    Marland Kitchen atorvastatin (LIPITOR) 40 MG tablet Take 80 mg by mouth at bedtime.     . carvedilol (COREG) 25 MG tablet Take 25 mg by mouth 2 (two) times daily with a meal.     . cetirizine (ZYRTEC)  10 MG tablet Take 10 mg by mouth daily.    . diclofenac sodium (VOLTAREN) 1 % GEL Apply 1 application topically daily as needed.    . fluticasone (FLONASE) 50 MCG/ACT nasal spray Place 2 sprays into both nostrils daily as needed for allergies or rhinitis.    . polyvinyl alcohol (LIQUIFILM TEARS) 1.4 % ophthalmic solution Place 1 drop into both eyes daily as needed for dry eyes.    . promethazine (PHENERGAN) 50 MG tablet Take 50 mg by mouth every 6 (six) hours as needed for nausea or vomiting.    . sertraline (ZOLOFT) 25 MG tablet Take 25  mg by mouth at bedtime.    . tamsulosin (FLOMAX) 0.4 MG CAPS capsule Take 0.4 mg by mouth daily.     . Tiotropium Bromide-Olodaterol (STIOLTO RESPIMAT) 2.5-2.5 MCG/ACT AERS Inhale 2 puffs into the lungs every morning.    . vitamin B-12 (CYANOCOBALAMIN) 1000 MCG tablet Take 1,000 mcg by mouth daily.    Marland Kitchen zolpidem (AMBIEN) 5 MG tablet Take 2.5 mg by mouth at bedtime as needed for sleep (VERY RARELY TAKES).     No current facility-administered medications for this visit.    Facility-Administered Medications Ordered in Other Visits  Medication Dose Route Frequency Provider Last Rate Last Dose  . heparin lock flush 100 unit/mL  500 Units Intravenous Once [name], MD      . sodium chloride flush (NS) 0.9 % injection 10 mL  10 mL Intravenous PRN [name], MD   10 mL at 02/02/17 0851    OBJECTIVE: There were no vitals filed for this visit.   There is no height or weight on file to calculate BMI.    ECOG FS:0 - Asymptomatic  General: Well-develop  ed, well-nourished, no acute distress. Eyes: Pink conjunctiva, anicteric sclera. Lungs: Clear to auscultation bilaterally. Heart: Regular rate and rhythm. No rubs, murmurs, or gallops. Abdomen: Soft, nontender, nondistended. No organomegaly noted, normoactive bowel sounds. Musculoskeletal: Mild, 1+ edema of left lower extremity.   Neuro: Alert, answering all questions appropriately. Cranial nerves grossly intact. Skin: No rashes or petechiae noted. Psych: Normal affect.   LAB RESULTS:  Lab Results  Component Value Date   NA 137 08/15/2017   K 4.4 08/15/2017   CL 108 08/15/2017   CO2 21 (L) 08/15/2017   GLUCOSE 112 (H) 08/15/2017   BUN 13 08/15/2017   CREATININE 1.06 08/15/2017   CALCIUM 7.8 (L) 08/15/2017   PROT 6.1 (L) 08/15/2017   ALBUMIN 2.6 (L) 08/15/2017   AST 29 08/15/2017   ALT 16 (L) 08/15/2017   ALKPHOS 70 08/15/2017   BILITOT 0.6 08/15/2017   GFRNONAA >60 08/15/2017   GFRAA >60 08/15/2017    Lab  Results  Component Value Date   WBC 7.4 08/15/2017   NEUTROABS 5.7 08/15/2017   HGB 10.9 (L) 08/15/2017   HCT 33.5 (L) 08/15/2017   MCV 87.0 08/15/2017   PLT 341 08/15/2017     STUDIES: No results found.  ASSESSMENT: Stage IIIA high-grade urothelial carcinoma.  PLAN:    1. Stage IIIA high-grade urothelial carcinoma: Outside CT scans and biopsy results reviewed independently confirming diagnosis and stage. PET scan completed at the [place]. patient initially completed 3 cycles of cisplatin and gemcitabine, but given suspicion of residual disease and delaying his surgery he received a fourth cycle completing on March 02, 2017.  Plan was to do a total cystectomy in January 2019, but this has not been scheduled yet.  Will get a  CT scan in the next 1-2 weeks for further evaluation.  Patient will then return to clinic in 3 months for further evaluation.    2.  Thrombocytopenia: Resolved. 3.  Neutropenia: Resolved. 4.  Aortic aneurysm: Patient recently completed his repair and grafting in December 2018. 5.  Smoking cessation: Patient states he has now quit cigarette smoking.    Patient expressed understanding and was in agreement with this plan. He also understands that He can call clinic at any time with any questions, concerns, or complaints.   Cancer Staging Bladder cancer [facility]) Staging form: Urinary Bladder, AJCC 8th Edition - Clinical stage from 09/04/2016: Stage IIIA (cT4a, cN0, cM0) - Signed by [name], MD on 09/04/2016   [name], MD   08/29/2017 11:32 PM

## 2017-08-31 ENCOUNTER — Inpatient Hospital Stay: Payer: Non-veteran care

## 2017-08-31 ENCOUNTER — Inpatient Hospital Stay: Payer: Non-veteran care | Admitting: Oncology

## 2017-09-12 ENCOUNTER — Emergency Department
Admission: EM | Admit: 2017-09-12 | Discharge: 2017-09-12 | Disposition: A | Discharge status: Home / Self Care | Payer: Non-veteran care | Attending: Emergency Medicine | Admitting: Emergency Medicine

## 2017-09-12 ENCOUNTER — Encounter: Payer: Self-pay | Admitting: Emergency Medicine

## 2017-09-12 DIAGNOSIS — Z87891 Personal history of nicotine dependence: Secondary | ICD-10-CM | POA: Diagnosis not present

## 2017-09-12 DIAGNOSIS — R197 Diarrhea, unspecified: Secondary | ICD-10-CM | POA: Diagnosis present

## 2017-09-12 LAB — GASTROINTESTINAL PANEL BY PCR, STOOL (REPLACES STOOL CULTURE)
Adenovirus F40/41: NOT DETECTED
Astrovirus: NOT DETECTED
CAMPYLOBACTER SPECIES: NOT DETECTED
CRYPTOSPORIDIUM: NOT DETECTED
CYCLOSPORA CAYETANENSIS: NOT DETECTED
Entamoeba histolytica: NOT DETECTED
Enteroaggregative E coli (EAEC): NOT DETECTED
Enteropathogenic E coli (EPEC): NOT DETECTED
Enterotoxigenic E coli (ETEC): NOT DETECTED
Giardia lamblia: NOT DETECTED
Norovirus GI/GII: NOT DETECTED
Plesimonas shigelloides: NOT DETECTED
Rotavirus A: NOT DETECTED
SALMONELLA SPECIES: NOT DETECTED
SHIGELLA/ENTEROINVASIVE E COLI (EIEC): NOT DETECTED
Sapovirus (I, II, IV, and V): NOT DETECTED
Shiga like toxin producing E coli (STEC): NOT DETECTED
VIBRIO SPECIES: NOT DETECTED
Vibrio cholerae: NOT DETECTED
YERSINIA ENTEROCOLITICA: NOT DETECTED

## 2017-09-12 LAB — CBC
HCT: 33.2 % — ABNORMAL LOW (ref 40.0–52.0)
HEMOGLOBIN: 11.1 g/dL — AB (ref 13.0–18.0)
MCH: 28 pg (ref 26.0–34.0)
MCHC: 33.3 g/dL (ref 32.0–36.0)
MCV: 84.1 fL (ref 80.0–100.0)
PLATELETS: 258 10*3/uL (ref 150–440)
RBC: 3.95 MIL/uL — AB (ref 4.40–5.90)
RDW: 16.7 % — ABNORMAL HIGH (ref 11.5–14.5)
WBC: 3.5 10*3/uL — ABNORMAL LOW (ref 3.8–10.6)

## 2017-09-12 LAB — COMPREHENSIVE METABOLIC PANEL
ALK PHOS: 70 U/L (ref 38–126)
ALT: 14 U/L — ABNORMAL LOW (ref 17–63)
ANION GAP: 11 (ref 5–15)
AST: 19 U/L (ref 15–41)
Albumin: 2.7 g/dL — ABNORMAL LOW (ref 3.5–5.0)
BILIRUBIN TOTAL: 0.6 mg/dL (ref 0.3–1.2)
BUN: 17 mg/dL (ref 6–20)
CALCIUM: 8 mg/dL — AB (ref 8.9–10.3)
CO2: 23 mmol/L (ref 22–32)
Chloride: 102 mmol/L (ref 101–111)
Creatinine, Ser: 0.82 mg/dL (ref 0.61–1.24)
GFR calc Af Amer: >60 mL/min (ref >60)
GFR calc non Af Amer: >60 mL/min (ref >60)
Glucose, Bld: 110 mg/dL — ABNORMAL HIGH (ref 65–99)
POTASSIUM: 2.9 mmol/L — AB (ref 3.5–5.1)
SODIUM: 136 mmol/L (ref 135–145)
TOTAL PROTEIN: 6.7 g/dL (ref 6.5–8.1)

## 2017-09-12 LAB — C DIFFICILE QUICK SCREEN W PCR REFLEX
C DIFFICILE (CDIFF) INTERP: NOT DETECTED
C DIFFICILE (CDIFF) TOXIN: NEGATIVE
C Diff antigen: NEGATIVE

## 2017-09-12 LAB — LIPASE, BLOOD: Lipase: 23 U/L (ref 11–51)

## 2017-09-12 MED ORDER — LOPERAMIDE HCL 2 MG PO TABS: 2.0000 mg | ORAL_TABLET | Freq: Four times a day (QID) | ORAL | 0 refills | Status: AC | PRN

## 2017-09-12 MED ORDER — POTASSIUM CHLORIDE CRYS ER 20 MEQ PO TBCR
40.0000 meq | EXTENDED_RELEASE_TABLET | Freq: Once | ORAL | Status: AC
  Administered 2017-09-12: 40 meq via ORAL
  Filled 2017-09-12: qty 2

## 2017-09-12 MED ORDER — POTASSIUM CHLORIDE ER 10 MEQ PO TBCR: 10.0000 meq | EXTENDED_RELEASE_TABLET | Freq: Every day | ORAL | 0 refills | Status: AC

## 2017-09-12 MED ORDER — LOPERAMIDE HCL 2 MG PO CAPS
4.0000 mg | ORAL_CAPSULE | Freq: Once | ORAL | Status: AC
  Administered 2017-09-12: 4 mg via ORAL
  Filled 2017-09-12: qty 2

## 2017-09-12 NOTE — ED Notes (Signed)
Pt is AOx4, vss, he is on the monitor, pt denies chest pain, shortness of breath, headache or dizziness at this time. Family is at the bedside. We will continue to monitor the pt.

## 2017-09-12 NOTE — ED Notes (Signed)
First nurse note: pt reports he has had diarrhea for "a few days." Pt alert and in NAD

## 2017-09-12 NOTE — ED Notes (Signed)
ED Provider at bedside. 

## 2017-09-12 NOTE — ED Provider Notes (Signed)
[facility] Emergency Department Provider Note       Time seen: ----------------------------------------- 2:28 PM on 09/12/2017 -----------------------------------------   I have reviewed the triage vital signs and the nursing notes.  HISTORY   Chief Complaint Diarrhea    HPI Gabriel Robbins is a 72 y.o. male with a history of allergies, coronary artery disease, depression, MI and prostate cancer who presents to the ED for diarrhea for the past several days.  Patient states he called his primary care doctor who was worried he may have an electrolyte imbalance.  Patient states he is had persistent diarrhea up to a dozen times a day since Friday.  He is still having diarrhea, has not had any nausea, vomiting or abdominal pain.  He also does not feel weak or lightheaded.  Past Medical History:  Diagnosis Date  . Allergy   . Coronary artery disease   . Depression   . Myocardial infarction ([place])   . Prostate cancer [facility])     Patient Active Problem List   Diagnosis Date Noted  . Bladder cancer ([place]) 09/01/2016    Past Surgical History:  Procedure Laterality Date  . BLADDER SURGERY    . CARDIAC SURGERY    . PORTA CATH INSERTION N/A 09/22/2016   Procedure: Glori Luis Cath Insertion;  Surgeon: Algernon Huxley, MD;  Location: [place] [facility];  Service: Cardiovascular;  Laterality: N/A;    Allergies Penicillin g  Social History Social History   Tobacco Use  . Smoking status: Former Smoker    Packs/day: 0.50    Years: 50.00    Pack years: 25.00  . Smokeless tobacco: Never Used  Substance Use Topics  . Alcohol use: Yes    Comment: very rare  . Drug use: No   Review of Systems Constitutional: Negative for fever. Cardiovascular: Negative for chest pain. Respiratory: Negative for shortness of breath. Gastrointestinal: Negative for abdominal pain, positive for diarrhea Genitourinary: Negative for dysuria. Musculoskeletal: Negative for back  pain. Skin: Negative for rash. Neurological: Negative for headaches, focal weakness or numbness.  All systems negative/normal/unremarkable except as stated in the HPI  ____________________________________________   PHYSICAL EXAM:  VITAL SIGNS: ED Triage Vitals  Enc Vitals Group     BP 09/12/17 1236 124/79     Pulse Rate 09/12/17 1236 (!) 115     Resp 09/12/17 1236 20     Temp 09/12/17 1236 97.6 F (36.4 C)     Temp Source 09/12/17 1236 Oral     SpO2 09/12/17 1236 94 %     Weight 09/12/17 1238 238 lb (108 kg)     Height 09/12/17 1238 6' (1.829 m)     Head Circumference --      Peak Flow --      Pain Score --      Pain Loc --      Pain Edu? --      Excl. in [place]? --    Constitutional: Alert and oriented. Well appearing and in no distress. Eyes: Conjunctivae are normal. Normal extraocular movements. Cardiovascular: Normal rate, regular rhythm. No murmurs, rubs, or gallops. Respiratory: Normal respiratory effort without tachypnea nor retractions. Breath sounds are clear and equal bilaterally. No wheezes/rales/rhonchi. Gastrointestinal: Soft and nontender. Normal bowel sounds.  Urostomy bag is in place and appears unremarkable Musculoskeletal: Nontender with normal range of motion in extremities. No lower extremity tenderness nor edema. Neurologic:  Normal speech and language. No gross focal neurologic deficits are appreciated.  Skin:  Skin is  warm, dry and intact. No rash noted. Psychiatric: Mood and affect are normal. Speech and behavior are normal.  ___________________________________________  ED COURSE:  As part of my medical decision making, I reviewed the following data within the [place] History obtained from family if available, nursing notes, old chart and ekg, as well as notes from prior ED visits. Patient presented for diarrhea, we will assess with labs as indicated at this time.   Procedures ____________________________________________   LABS  (pertinent positives/negatives)  Labs Reviewed  COMPREHENSIVE METABOLIC PANEL - Abnormal; Notable for the following components:      Result Value   Potassium 2.9 (*)    Glucose, Bld 110 (*)    Calcium 8.0 (*)    Albumin 2.7 (*)    ALT 14 (*)    All other components within normal limits  CBC - Abnormal; Notable for the following components:   WBC 3.5 (*)    RBC 3.95 (*)    Hemoglobin 11.1 (*)    HCT 33.2 (*)    RDW 16.7 (*)    All other components within normal limits  GASTROINTESTINAL PANEL BY PCR, STOOL (REPLACES STOOL CULTURE)  C DIFFICILE QUICK SCREEN W PCR REFLEX  LIPASE, BLOOD  URINALYSIS, COMPLETE (UACMP) WITH MICROSCOPIC   ____________________________________________  DIFFERENTIAL DIAGNOSIS   Gastroenteritis, dehydration, electrolyte abnormality, C. difficile colitis  FINAL ASSESSMENT AND PLAN  Diarrhea   Plan: The patient had presented for persistent diarrhea over the weekend. Patient's labs did reveal mild hypokalemia but were otherwise reassuring.  His stool studies are pending at this time and have advised him that we will contact him with the results.  He actually produced a semisolid stool here.  He will be discharged with Imodium as well as potassium and is stable for outpatient follow-up, again patient is asymptomatic at this time.   Laurence Aly, MD   Note: This note was generated in part or whole with voice recognition software. Voice recognition is usually quite accurate but there are transcription errors that can and very often do occur. I apologize for any typographical errors that were not detected and corrected.     Earleen Newport, MD 09/12/17 [phone]

## 2017-09-12 NOTE — ED Notes (Signed)
Pt is being discharged home. Pt is AOX4, VSS, he does not show any signs of distress. Pt denies any chest pain, shortness of breath, n/v or headache. AVS was given and explained to pt and he verbalized understanding.

## 2017-09-12 NOTE — ED Notes (Signed)
Jimmye Norman, MD cancelled urinalysis.

## 2017-09-12 NOTE — ED Notes (Signed)
Housekeeping has been called to clean room

## 2017-09-12 NOTE — ED Triage Notes (Addendum)
Pt arrived with complaints of diarrhea for the "last few days." pt states he called his PCP who was worried he may have electrolyte imbalance. Pt states "I just want a clear bill of health." Pt states "I did feel nauseous but not now, I feel fine."

## 2017-11-18 ENCOUNTER — Emergency Department
Admission: EM | Admit: 2017-11-18 | Discharge: 2017-11-18 | Disposition: A | Discharge status: Home / Self Care | Payer: Non-veteran care | Attending: Emergency Medicine | Admitting: Emergency Medicine

## 2017-11-18 ENCOUNTER — Encounter: Payer: Self-pay | Admitting: Emergency Medicine

## 2017-11-18 DIAGNOSIS — Z87891 Personal history of nicotine dependence: Secondary | ICD-10-CM | POA: Insufficient documentation

## 2017-11-18 DIAGNOSIS — L7682 Other postprocedural complications of skin and subcutaneous tissue: Secondary | ICD-10-CM | POA: Diagnosis present

## 2017-11-18 DIAGNOSIS — Z936 Other artificial openings of urinary tract status: Secondary | ICD-10-CM | POA: Insufficient documentation

## 2017-11-18 DIAGNOSIS — Z79899 Other long term (current) drug therapy: Secondary | ICD-10-CM | POA: Insufficient documentation

## 2017-11-18 DIAGNOSIS — I252 Old myocardial infarction: Secondary | ICD-10-CM | POA: Diagnosis not present

## 2017-11-18 DIAGNOSIS — Z8551 Personal history of malignant neoplasm of bladder: Secondary | ICD-10-CM | POA: Insufficient documentation

## 2017-11-18 DIAGNOSIS — I2510 Atherosclerotic heart disease of native coronary artery without angina pectoris: Secondary | ICD-10-CM | POA: Insufficient documentation

## 2017-11-18 LAB — CBC
HCT: 29.4 % — ABNORMAL LOW (ref 40.0–52.0)
HEMOGLOBIN: 9.6 g/dL — AB (ref 13.0–18.0)
MCH: 25.8 pg — ABNORMAL LOW (ref 26.0–34.0)
MCHC: 32.8 g/dL (ref 32.0–36.0)
MCV: 78.8 fL — ABNORMAL LOW (ref 80.0–100.0)
Platelets: 146 10*3/uL — ABNORMAL LOW (ref 150–440)
RBC: 3.73 MIL/uL — AB (ref 4.40–5.90)
RDW: 17.2 % — ABNORMAL HIGH (ref 11.5–14.5)
WBC: 7.3 10*3/uL (ref 3.8–10.6)

## 2017-11-18 NOTE — ED Triage Notes (Addendum)
Pt arrived with concerns after noticing blood in urine bag when he woke up. Pt has urostomy that was placed on march 8th. Pt denies any complications after discharge. Pt's lower abdomen distended; pt states that is a known problem with the fascia and plans for corrective surgery "in a year." Pt in NAD

## 2017-11-18 NOTE — Discharge Instructions (Addendum)
As we discussed, please see your surgeon at the [place] today

## 2017-11-18 NOTE — ED Provider Notes (Signed)
[facility] Emergency Department Provider Note   ____________________________________________    I have reviewed the triage vital signs and the nursing notes.   HISTORY  Chief Complaint Postoperative bleeding    HPI Gabriel Robbins is a 72 y.o. male with a history of bladder cancer with urostomy presents today with postprocedure bleeding.  Patient reports he had a drain placed into his abdomen to drain an unknown fluid collection, this was performed at the [place] 2 days ago.  Apparently he developed bleeding from the insertion site overnight.  He is a symptomatically has no complaints.  No abdominal pain.  No dizziness.  He reports the drainage in the bag initially was yellow but is now pink-tinged   Past Medical History:  Diagnosis Date  . Allergy   . Coronary artery disease   . Depression   . Myocardial infarction ([place])   . Prostate cancer [facility])     Patient Active Problem List   Diagnosis Date Noted  . Bladder cancer ([place]) 09/01/2016    Past Surgical History:  Procedure Laterality Date  . BLADDER SURGERY    . CARDIAC SURGERY    . PORTA CATH INSERTION N/A 09/22/2016   Procedure: Glori Luis Cath Insertion;  Surgeon: Algernon Huxley, MD;  Location: [place] [facility];  Service: Cardiovascular;  Laterality: N/A;    Prior to Admission medications   Medication Sig Start Date End Date Taking? Authorizing Provider  acetaminophen (TYLENOL) 325 MG tablet Take 650 mg by mouth 2 (two) times daily as needed for mild pain or moderate pain.    [provider]  albuterol (PROVENTIL HFA;VENTOLIN HFA) 108 (90 Base) MCG/ACT inhaler Inhale 2 puffs into the lungs every 4 (four) hours as needed for wheezing or shortness of breath.    [provider]  ammonium lactate (LAC-HYDRIN) 12 % lotion Apply 1 application topically daily as needed for dry skin.    [provider]  atorvastatin (LIPITOR) 40 MG tablet Take 80 mg by mouth at bedtime.      [provider]  carvedilol (COREG) 25 MG tablet Take 25 mg by mouth 2 (two) times daily with a meal.     [provider]  cetirizine (ZYRTEC) 10 MG tablet Take 10 mg by mouth daily.    [provider]  diclofenac sodium (VOLTAREN) 1 % GEL Apply 1 application topically daily as needed.    [provider]  fluticasone (FLONASE) 50 MCG/ACT nasal spray Place 2 sprays into both nostrils daily as needed for allergies or rhinitis.    [provider]  loperamide (IMODIUM A-D) 2 MG tablet Take 1 tablet (2 mg total) by mouth 4 (four) times daily as needed for diarrhea or loose stools. 09/12/17   Earleen Newport, MD  polyvinyl alcohol (LIQUIFILM TEARS) 1.4 % ophthalmic solution Place 1 drop into both eyes daily as needed for dry eyes.    [provider]  potassium chloride (K-DUR) 10 MEQ tablet Take 1 tablet (10 mEq total) by mouth daily. 09/12/17   Earleen Newport, MD  promethazine (PHENERGAN) 50 MG tablet Take 50 mg by mouth every 6 (six) hours as needed for nausea or vomiting.    [provider]  sertraline (ZOLOFT) 25 MG tablet Take 25 mg by mouth at bedtime.    [provider]  tamsulosin (FLOMAX) 0.4 MG CAPS capsule Take 0.4 mg by mouth daily.     [provider]  Tiotropium Bromide-Olodaterol (STIOLTO RESPIMAT) 2.5-2.5 MCG/ACT  AERS Inhale 2 puffs into the lungs every morning.    [provider]  vitamin B-12 (CYANOCOBALAMIN) 1000 MCG tablet Take 1,000 mcg by mouth daily.    [provider]  zolpidem (AMBIEN) 5 MG tablet Take 2.5 mg by mouth at bedtime as needed for sleep (VERY RARELY TAKES).    [provider]     Allergies Penicillin g  Family History  Problem Relation Age of Onset  . Thyroid disease Mother   . Heart attack Father   . Heart disease Brother   . Parkinson's disease Brother     Social History Social History   Tobacco Use  . Smoking status: Former Smoker     Packs/day: 0.50    Years: 50.00    Pack years: 25.00  . Smokeless tobacco: Never Used  Substance Use Topics  . Alcohol use: Yes    Comment: very rare  . Drug use: No    Review of Systems  Constitutional: No fever Eyes: No blurry vision ENT: No neck pain. Cardiovascular: Denies palpitations Respiratory: Denies shortness of breath. Gastrointestinal: No abdominal pain.     Genitourinary: Negative for dysuria. Musculoskeletal: Negative for back pain. Skin: As above Neurological: Negative for headaches    ____________________________________________   PHYSICAL EXAM:  VITAL SIGNS: ED Triage Vitals  Enc Vitals Group     BP 11/18/17 0946 114/76     Pulse Rate 11/18/17 0946 (!) 102     Resp 11/18/17 0946 18     Temp 11/18/17 0946 97.6 F (36.4 C)     Temp Source 11/18/17 0946 Oral     SpO2 11/18/17 0946 99 %     Weight 11/18/17 0945 89.8 kg (198 lb)     Height 11/18/17 0945 1.803 m (5\' 11" )     Head Circumference --      Peak Flow --      Pain Score 11/18/17 0945 4     Pain Loc --      Pain Edu? --      Excl. in [place]? --     Constitutional: Alert and oriented Eyes: Conjunctivae are normal.    Cardiovascular: Normal rate, regular rhythm. Grossly normal heart sounds.  Good peripheral circulation. Respiratory: Normal respiratory effort.  No retractions.  Gastrointestinal: Soft and nontender. No distention.  Urostomy, yellow urine.  No abdominal tenderness.  Left lower abdomen drainage tube draining pink fluid.  Dressing surrounding insertion site has dried blood on it but is not actively bleeding  Musculoskeletal:  Warm and well perfused Neurologic:  Normal speech and language. No gross focal neurologic deficits are appreciated.  Skin:  Skin is warm, dry and intact. No rash noted.   ____________________________________________   LABS (all labs ordered are listed, but only abnormal results are displayed)  Labs Reviewed  CBC - Abnormal; Notable for the following  components:      Result Value   RBC 3.73 (*)    Hemoglobin 9.6 (*)    HCT 29.4 (*)    MCV 78.8 (*)    MCH 25.8 (*)    RDW 17.2 (*)    Platelets 146 (*)    All other components within normal limits   ____________________________________________  EKG  None ____________________________________________  RADIOLOGY  None ____________________________________________   PROCEDURES  Procedure(s) performed: No  Procedures   Critical Care performed: No ____________________________________________   INITIAL IMPRESSION / ASSESSMENT AND PLAN / ED COURSE  Pertinent labs & imaging results that were available during my care of  the patient were reviewed by me and considered in my medical decision making (see chart for details).  Unfortunately we are unable to access [place] records and is unclear what this drain is draining.  Bleeding seems to be controlled, discussed with the patient that we will check hemoglobin to make sure that he is not having massive abdominal bleeding otherwise significantly doubt this given no abdominal tenderness and normal vitals.  Hemoglobin is normal, offered further work-up including CT scan and evaluation here but patient would prefer to drive himself to the [place] knowing that his hemoglobin is not dangerously low.  I think this is reasonable given that all records and surgeons are at the [facility]    ____________________________________________   FINAL CLINICAL IMPRESSION(S) / ED DIAGNOSES  Final diagnoses:  Bleeding at insertion site        Note:  This document was prepared using Dragon voice recognition software and may include unintentional dictation errors.    Lavonia Drafts, MD 11/18/17 1308

## 2017-12-16 ENCOUNTER — Inpatient Hospital Stay
Admission: EM | Admit: 2017-12-16 | Discharge: 2017-12-25 | DRG: 871 | Disposition: E | Payer: Medicare Other | Attending: Internal Medicine | Admitting: Internal Medicine

## 2017-12-16 ENCOUNTER — Emergency Department: Payer: Medicare Other

## 2017-12-16 ENCOUNTER — Encounter: Payer: Self-pay | Admitting: Intensive Care

## 2017-12-16 ENCOUNTER — Other Ambulatory Visit: Payer: Self-pay

## 2017-12-16 DIAGNOSIS — Z7189 Other specified counseling: Secondary | ICD-10-CM

## 2017-12-16 DIAGNOSIS — D630 Anemia in neoplastic disease: Secondary | ICD-10-CM | POA: Diagnosis present

## 2017-12-16 DIAGNOSIS — Z515 Encounter for palliative care: Secondary | ICD-10-CM

## 2017-12-16 DIAGNOSIS — J309 Allergic rhinitis, unspecified: Secondary | ICD-10-CM | POA: Diagnosis present

## 2017-12-16 DIAGNOSIS — A419 Sepsis, unspecified organism: Secondary | ICD-10-CM

## 2017-12-16 DIAGNOSIS — I472 Ventricular tachycardia, unspecified: Secondary | ICD-10-CM

## 2017-12-16 DIAGNOSIS — Z906 Acquired absence of other parts of urinary tract: Secondary | ICD-10-CM

## 2017-12-16 DIAGNOSIS — G9340 Encephalopathy, unspecified: Secondary | ICD-10-CM | POA: Diagnosis present

## 2017-12-16 DIAGNOSIS — Z8349 Family history of other endocrine, nutritional and metabolic diseases: Secondary | ICD-10-CM

## 2017-12-16 DIAGNOSIS — I2510 Atherosclerotic heart disease of native coronary artery without angina pectoris: Secondary | ICD-10-CM | POA: Diagnosis present

## 2017-12-16 DIAGNOSIS — Z820 Family history of epilepsy and other diseases of the nervous system: Secondary | ICD-10-CM

## 2017-12-16 DIAGNOSIS — C787 Secondary malignant neoplasm of liver and intrahepatic bile duct: Secondary | ICD-10-CM | POA: Diagnosis present

## 2017-12-16 DIAGNOSIS — C679 Malignant neoplasm of bladder, unspecified: Secondary | ICD-10-CM | POA: Diagnosis present

## 2017-12-16 DIAGNOSIS — J189 Pneumonia, unspecified organism: Secondary | ICD-10-CM | POA: Diagnosis present

## 2017-12-16 DIAGNOSIS — D696 Thrombocytopenia, unspecified: Secondary | ICD-10-CM

## 2017-12-16 DIAGNOSIS — Z8551 Personal history of malignant neoplasm of bladder: Secondary | ICD-10-CM

## 2017-12-16 DIAGNOSIS — N179 Acute kidney failure, unspecified: Secondary | ICD-10-CM

## 2017-12-16 DIAGNOSIS — Z7951 Long term (current) use of inhaled steroids: Secondary | ICD-10-CM

## 2017-12-16 DIAGNOSIS — I959 Hypotension, unspecified: Secondary | ICD-10-CM | POA: Diagnosis present

## 2017-12-16 DIAGNOSIS — Y95 Nosocomial condition: Secondary | ICD-10-CM | POA: Diagnosis present

## 2017-12-16 DIAGNOSIS — R079 Chest pain, unspecified: Secondary | ICD-10-CM | POA: Diagnosis not present

## 2017-12-16 DIAGNOSIS — Z87891 Personal history of nicotine dependence: Secondary | ICD-10-CM

## 2017-12-16 DIAGNOSIS — J9601 Acute respiratory failure with hypoxia: Secondary | ICD-10-CM | POA: Diagnosis present

## 2017-12-16 DIAGNOSIS — A4159 Other Gram-negative sepsis: Secondary | ICD-10-CM | POA: Diagnosis not present

## 2017-12-16 DIAGNOSIS — E876 Hypokalemia: Secondary | ICD-10-CM | POA: Diagnosis present

## 2017-12-16 DIAGNOSIS — R7881 Bacteremia: Secondary | ICD-10-CM | POA: Diagnosis present

## 2017-12-16 DIAGNOSIS — R739 Hyperglycemia, unspecified: Secondary | ICD-10-CM | POA: Diagnosis present

## 2017-12-16 DIAGNOSIS — G893 Neoplasm related pain (acute) (chronic): Secondary | ICD-10-CM

## 2017-12-16 DIAGNOSIS — R0600 Dyspnea, unspecified: Secondary | ICD-10-CM

## 2017-12-16 DIAGNOSIS — J440 Chronic obstructive pulmonary disease with acute lower respiratory infection: Secondary | ICD-10-CM | POA: Diagnosis present

## 2017-12-16 DIAGNOSIS — C7931 Secondary malignant neoplasm of brain: Secondary | ICD-10-CM | POA: Diagnosis present

## 2017-12-16 DIAGNOSIS — I480 Paroxysmal atrial fibrillation: Secondary | ICD-10-CM | POA: Diagnosis present

## 2017-12-16 DIAGNOSIS — Z66 Do not resuscitate: Secondary | ICD-10-CM | POA: Diagnosis present

## 2017-12-16 DIAGNOSIS — D509 Iron deficiency anemia, unspecified: Secondary | ICD-10-CM

## 2017-12-16 DIAGNOSIS — Z8679 Personal history of other diseases of the circulatory system: Secondary | ICD-10-CM

## 2017-12-16 DIAGNOSIS — M545 Low back pain: Secondary | ICD-10-CM | POA: Diagnosis present

## 2017-12-16 DIAGNOSIS — R2243 Localized swelling, mass and lump, lower limb, bilateral: Secondary | ICD-10-CM

## 2017-12-16 DIAGNOSIS — F329 Major depressive disorder, single episode, unspecified: Secondary | ICD-10-CM | POA: Diagnosis present

## 2017-12-16 DIAGNOSIS — Z8249 Family history of ischemic heart disease and other diseases of the circulatory system: Secondary | ICD-10-CM

## 2017-12-16 DIAGNOSIS — F411 Generalized anxiety disorder: Secondary | ICD-10-CM

## 2017-12-16 DIAGNOSIS — J441 Chronic obstructive pulmonary disease with (acute) exacerbation: Secondary | ICD-10-CM | POA: Diagnosis present

## 2017-12-16 DIAGNOSIS — I2489 Other forms of acute ischemic heart disease: Secondary | ICD-10-CM

## 2017-12-16 DIAGNOSIS — Z880 Allergy status to penicillin: Secondary | ICD-10-CM

## 2017-12-16 DIAGNOSIS — I248 Other forms of acute ischemic heart disease: Secondary | ICD-10-CM | POA: Diagnosis present

## 2017-12-16 DIAGNOSIS — C7800 Secondary malignant neoplasm of unspecified lung: Secondary | ICD-10-CM | POA: Diagnosis present

## 2017-12-16 DIAGNOSIS — Z952 Presence of prosthetic heart valve: Secondary | ICD-10-CM

## 2017-12-16 DIAGNOSIS — Z8546 Personal history of malignant neoplasm of prostate: Secondary | ICD-10-CM

## 2017-12-16 DIAGNOSIS — I252 Old myocardial infarction: Secondary | ICD-10-CM

## 2017-12-16 HISTORY — DX: Malignant neoplasm of bladder, unspecified: C67.9

## 2017-12-16 HISTORY — DX: Presence of prosthetic heart valve: Z95.2

## 2017-12-16 HISTORY — DX: Abdominal aortic aneurysm, without rupture: I71.4

## 2017-12-16 HISTORY — DX: Unspecified atrial fibrillation: I48.91

## 2017-12-16 LAB — CBC WITH DIFFERENTIAL/PLATELET
Basophils Absolute: 0 10*3/uL (ref 0–0.1)
Basophils Relative: 0 %
EOS ABS: 0 10*3/uL (ref 0–0.7)
EOS PCT: 0 %
HCT: 26.3 % — ABNORMAL LOW (ref 40.0–52.0)
Hemoglobin: 8.3 g/dL — ABNORMAL LOW (ref 13.0–18.0)
Lymphocytes Relative: 3 %
Lymphs Abs: 0.4 10*3/uL — ABNORMAL LOW (ref 1.0–3.6)
MCH: 23.6 pg — ABNORMAL LOW (ref 26.0–34.0)
MCHC: 31.5 g/dL — AB (ref 32.0–36.0)
MCV: 75 fL — ABNORMAL LOW (ref 80.0–100.0)
MONOS PCT: 2 %
Monocytes Absolute: 0.3 10*3/uL (ref 0.2–1.0)
Neutro Abs: 13.9 10*3/uL — ABNORMAL HIGH (ref 1.4–6.5)
Neutrophils Relative %: 95 %
RBC: 3.5 MIL/uL — ABNORMAL LOW (ref 4.40–5.90)
RDW: 18.8 % — AB (ref 11.5–14.5)
WBC: 14.6 10*3/uL — ABNORMAL HIGH (ref 3.8–10.6)

## 2017-12-16 LAB — COMPREHENSIVE METABOLIC PANEL
ALK PHOS: 274 U/L — AB (ref 38–126)
ALT: 14 U/L (ref 0–44)
ANION GAP: 15 (ref 5–15)
AST: 22 U/L (ref 15–41)
Albumin: 2.4 g/dL — ABNORMAL LOW (ref 3.5–5.0)
BUN: 37 mg/dL — ABNORMAL HIGH (ref 8–23)
CALCIUM: 8.5 mg/dL — AB (ref 8.9–10.3)
CO2: 25 mmol/L (ref 22–32)
Chloride: 99 mmol/L (ref 98–111)
Creatinine, Ser: 1.66 mg/dL — ABNORMAL HIGH (ref 0.61–1.24)
GFR calc non Af Amer: 40 mL/min — ABNORMAL LOW (ref >60)
GFR, EST AFRICAN AMERICAN: 46 mL/min — AB (ref >60)
Glucose, Bld: 119 mg/dL — ABNORMAL HIGH (ref 70–99)
POTASSIUM: 3.4 mmol/L — AB (ref 3.5–5.1)
Sodium: 139 mmol/L (ref 135–145)
TOTAL PROTEIN: 6.1 g/dL — AB (ref 6.5–8.1)
Total Bilirubin: 1.1 mg/dL (ref 0.3–1.2)

## 2017-12-16 LAB — LACTIC ACID, PLASMA
Lactic Acid, Venous: 3.4 mmol/L (ref 0.5–1.9)
Lactic Acid, Venous: 3 mmol/L (ref 0.5–1.9)
Lactic Acid, Venous: 3.1 mmol/L (ref 0.5–1.9)

## 2017-12-16 LAB — CREATININE, SERUM
Creatinine, Ser: 1.56 mg/dL — ABNORMAL HIGH (ref 0.61–1.24)
GFR, EST AFRICAN AMERICAN: 50 mL/min — AB (ref >60)
GFR, EST NON AFRICAN AMERICAN: 43 mL/min — AB (ref >60)

## 2017-12-16 LAB — CBC
HEMATOCRIT: 22.9 % — AB (ref 40.0–52.0)
HEMOGLOBIN: 7.5 g/dL — AB (ref 13.0–18.0)
MCH: 24.8 pg — ABNORMAL LOW (ref 26.0–34.0)
MCHC: 32.8 g/dL (ref 32.0–36.0)
MCV: 75.7 fL — ABNORMAL LOW (ref 80.0–100.0)
Platelets: UNDETERMINED 10*3/uL (ref 150–440)
RBC: 3.03 MIL/uL — ABNORMAL LOW (ref 4.40–5.90)
RDW: 18.3 % — AB (ref 11.5–14.5)
WBC: 10.5 10*3/uL (ref 3.8–10.6)

## 2017-12-16 LAB — BRAIN NATRIURETIC PEPTIDE: B NATRIURETIC PEPTIDE 5: 170 pg/mL — AB (ref 0.0–100.0)

## 2017-12-16 LAB — TROPONIN I
TROPONIN I: 0.09 ng/mL — AB (ref <0.03)
Troponin I: 0.09 ng/mL (ref <0.03)

## 2017-12-16 LAB — PROCALCITONIN: PROCALCITONIN: 3.69 ng/mL

## 2017-12-16 LAB — LIPASE, BLOOD: LIPASE: 18 U/L (ref 11–51)

## 2017-12-16 LAB — ETHANOL: Alcohol, Ethyl (B): <10 mg/dL (ref <10)

## 2017-12-16 MED ORDER — PROCHLORPERAZINE MALEATE 5 MG PO TABS
5.0000 mg | ORAL_TABLET | Freq: Two times a day (BID) | ORAL | Status: DC | PRN
  Filled 2017-12-16: qty 1

## 2017-12-16 MED ORDER — MORPHINE SULFATE (PF) 4 MG/ML IV SOLN
4.0000 mg | INTRAVENOUS | Status: DC | PRN
  Administered 2017-12-16 – 2017-12-18 (×5): 4 mg via INTRAVENOUS
  Filled 2017-12-16 (×6): qty 1

## 2017-12-16 MED ORDER — ALBUTEROL SULFATE (2.5 MG/3ML) 0.083% IN NEBU: 2.5000 mg | INHALATION_SOLUTION | Freq: Four times a day (QID) | RESPIRATORY_TRACT | Status: DC | PRN

## 2017-12-16 MED ORDER — GUAIFENESIN 100 MG/5ML PO SOLN
5.0000 mL | ORAL | Status: DC | PRN
  Administered 2017-12-16: 100 mg via ORAL
  Administered 2017-12-17: 23:00:00 200 mg via ORAL
  Administered 2017-12-18: 06:00:00 100 mg via ORAL
  Filled 2017-12-16 (×4): qty 10

## 2017-12-16 MED ORDER — SENNOSIDES-DOCUSATE SODIUM 8.6-50 MG PO TABS: 2.0000 | ORAL_TABLET | Freq: Two times a day (BID) | ORAL | Status: DC | PRN

## 2017-12-16 MED ORDER — VANCOMYCIN HCL IN DEXTROSE 1-5 GM/200ML-% IV SOLN
1000.0000 mg | Freq: Once | INTRAVENOUS | Status: AC
  Administered 2017-12-16: 1000 mg via INTRAVENOUS
  Filled 2017-12-16: qty 200

## 2017-12-16 MED ORDER — POTASSIUM CHLORIDE CRYS ER 20 MEQ PO TBCR
40.0000 meq | EXTENDED_RELEASE_TABLET | Freq: Once | ORAL | Status: AC
  Administered 2017-12-16: 40 meq via ORAL
  Filled 2017-12-16: qty 2

## 2017-12-16 MED ORDER — OXYCODONE-ACETAMINOPHEN 5-325 MG PO TABS
ORAL_TABLET | ORAL | Status: AC
  Filled 2017-12-16: qty 2

## 2017-12-16 MED ORDER — SODIUM CHLORIDE 0.9 % IV BOLUS
1000.0000 mL | Freq: Once | INTRAVENOUS | Status: AC
  Administered 2017-12-16: 1000 mL via INTRAVENOUS

## 2017-12-16 MED ORDER — METHYLPREDNISOLONE SODIUM SUCC 125 MG IJ SOLR
60.0000 mg | Freq: Four times a day (QID) | INTRAMUSCULAR | Status: DC
  Administered 2017-12-16 – 2017-12-18 (×6): 60 mg via INTRAVENOUS
  Filled 2017-12-16 (×8): qty 2

## 2017-12-16 MED ORDER — RISAQUAD PO CAPS
1.0000 | ORAL_CAPSULE | Freq: Every day | ORAL | Status: DC
  Administered 2017-12-17 – 2017-12-18 (×2): 1 via ORAL
  Filled 2017-12-16 (×2): qty 1

## 2017-12-16 MED ORDER — VANCOMYCIN HCL 10 G IV SOLR
1250.0000 mg | INTRAVENOUS | Status: DC
  Administered 2017-12-17: 1250 mg via INTRAVENOUS
  Filled 2017-12-16 (×2): qty 1250

## 2017-12-16 MED ORDER — ACETAMINOPHEN 650 MG RE SUPP: 650.0000 mg | Freq: Four times a day (QID) | RECTAL | Status: DC | PRN

## 2017-12-16 MED ORDER — ENOXAPARIN SODIUM 40 MG/0.4ML ~~LOC~~ SOLN
40.0000 mg | SUBCUTANEOUS | Status: DC
  Administered 2017-12-16: 40 mg via SUBCUTANEOUS
  Filled 2017-12-16: qty 0.4

## 2017-12-16 MED ORDER — MORPHINE SULFATE (PF) 4 MG/ML IV SOLN
INTRAVENOUS | Status: AC
  Administered 2017-12-16: 4 mg via INTRAVENOUS
  Filled 2017-12-16: qty 1

## 2017-12-16 MED ORDER — SODIUM CHLORIDE 0.9 % IV SOLN
INTRAVENOUS | Status: DC
  Administered 2017-12-16: 23:00:00 via INTRAVENOUS

## 2017-12-16 MED ORDER — SODIUM CHLORIDE 0.9 % IV SOLN
2.0000 g | Freq: Two times a day (BID) | INTRAVENOUS | Status: DC
  Administered 2017-12-17: 06:00:00 2 g via INTRAVENOUS
  Filled 2017-12-16 (×2): qty 2

## 2017-12-16 MED ORDER — DICLOFENAC SODIUM 1 % TD GEL
4.0000 g | Freq: Four times a day (QID) | TRANSDERMAL | Status: DC | PRN
  Filled 2017-12-16: qty 100

## 2017-12-16 MED ORDER — SODIUM CHLORIDE 0.9 % IV BOLUS (SEPSIS)
1000.0000 mL | Freq: Once | INTRAVENOUS | Status: AC
  Administered 2017-12-16: 1000 mL via INTRAVENOUS

## 2017-12-16 MED ORDER — SERTRALINE HCL 25 MG PO TABS
25.0000 mg | ORAL_TABLET | Freq: Every day | ORAL | Status: DC
  Administered 2017-12-16 – 2017-12-18 (×3): 25 mg via ORAL
  Filled 2017-12-16 (×4): qty 1

## 2017-12-16 MED ORDER — ACETAMINOPHEN 325 MG PO TABS: 650.0000 mg | ORAL_TABLET | Freq: Four times a day (QID) | ORAL | Status: DC | PRN

## 2017-12-16 MED ORDER — ARFORMOTEROL TARTRATE 15 MCG/2ML IN NEBU
15.0000 ug | INHALATION_SOLUTION | Freq: Two times a day (BID) | RESPIRATORY_TRACT | Status: DC
  Administered 2017-12-17 – 2017-12-19 (×5): 15 ug via RESPIRATORY_TRACT
  Filled 2017-12-16 (×6): qty 2

## 2017-12-16 MED ORDER — SODIUM CHLORIDE 0.9 % IV SOLN
2.0000 g | Freq: Once | INTRAVENOUS | Status: AC
  Administered 2017-12-16: 2 g via INTRAVENOUS
  Filled 2017-12-16: qty 2

## 2017-12-16 MED ORDER — IPRATROPIUM-ALBUTEROL 0.5-2.5 (3) MG/3ML IN SOLN
3.0000 mL | Freq: Four times a day (QID) | RESPIRATORY_TRACT | Status: DC
  Administered 2017-12-16 – 2017-12-17 (×5): 3 mL via RESPIRATORY_TRACT
  Filled 2017-12-16 (×5): qty 3

## 2017-12-16 MED ORDER — CARVEDILOL 3.125 MG PO TABS
12.5000 mg | ORAL_TABLET | Freq: Two times a day (BID) | ORAL | Status: DC
  Administered 2017-12-18: 12.5 mg via ORAL
  Filled 2017-12-16 (×2): qty 4

## 2017-12-16 MED ORDER — SODIUM CHLORIDE 0.9 % IV BOLUS
1000.0000 mL | Freq: Once | INTRAVENOUS | Status: AC
Start: 2017-12-16 — End: 2017-12-16
  Administered 2017-12-16: 1000 mL via INTRAVENOUS

## 2017-12-16 MED ORDER — OXYCODONE-ACETAMINOPHEN 5-325 MG PO TABS
2.0000 | ORAL_TABLET | Freq: Once | ORAL | Status: AC
  Administered 2017-12-16: 2 via ORAL

## 2017-12-16 MED ORDER — ALBUTEROL SULFATE (2.5 MG/3ML) 0.083% IN NEBU
2.5000 mg | INHALATION_SOLUTION | Freq: Four times a day (QID) | RESPIRATORY_TRACT | Status: DC | PRN
  Administered 2017-12-18: 2.5 mg via RESPIRATORY_TRACT
  Filled 2017-12-16: qty 3

## 2017-12-16 MED ORDER — MELATONIN 5 MG PO TABS
5.0000 mg | ORAL_TABLET | Freq: Every evening | ORAL | Status: DC | PRN
  Filled 2017-12-16: qty 2

## 2017-12-16 NOTE — Progress Notes (Signed)
Family Meeting Note  Advance Directive:yes  Today a meeting took place with the Patient and son.  Patient is able to participate.  The following clinical team members were present during this meeting:MD  The following were discussed:Patient's diagnosis: , Patient's progosis: Unable to determine and Goals for treatment: DNR  Additional follow-up to be provided: prn  Time spent during discussion:20 minutes  Evette Doffing, MD

## 2017-12-16 NOTE — ED Notes (Signed)
Per Dr. Brett Albino, pt to be given solu medrol, bolus, and duoneb.

## 2017-12-16 NOTE — ED Notes (Signed)
Pt has urostomy due to having bladder removed.

## 2017-12-16 NOTE — ED Triage Notes (Signed)
Patient arrived by EMS from home for CP. EMS reported hypotension b/p 88/48.  C/o intermittent SOB. Does not normally wear O2 at home. Patient has HX bladder cancer and has spread to liver and possibly lung. Patient is seen at [facility] for most care.  A&O x4 upon arrival to ER.

## 2017-12-16 NOTE — ED Notes (Signed)
Dr Brett Albino aware of vital signs, cleared to go to the floor

## 2017-12-16 NOTE — H&P (Signed)
Gabriel Robbins at [place] NAME: Gabriel Robbins    MR#:  [id number]  DATE OF BIRTH:  1945-08-17  DATE OF ADMISSION:  11/26/2017  PRIMARY CARE PHYSICIAN: Gabriel Blackwater, NP   REQUESTING/REFERRING PHYSICIAN: Hinda Kehr, MD  CHIEF COMPLAINT:   Chief Complaint  Patient presents with  . Chest Pain  . Hypotension    HISTORY OF PRESENT ILLNESS:  Gabriel Robbins  is a 72 y.o. male with a known history of bladder cancer status post cystectomy with external urostomy bag, chronic atrial fibrillation, CAD with a history of MI, history aortic valve replacement, AAA status post repair 2018, and depression who presented to the ED with shortness of breath that started this afternoon.  He also endorses cough that is productive of mucus.  No fevers.  He endorses chills.  No orthopnea or lower extremity edema.  He endorses central chest pain that is worse with cough.  Chest pain is not worse with exertion.  In the ED, patient was meeting sepsis criteria with a heart rate of 101 and WBC 14.6.  Patient was requiring 3L O2 by nasal cannula.  Lactic acid was 3.4.  Procalcitonin 3.69.  CXR showed interstitial edema and possible atypical pneumonia.  He was started on Vancomycin and cefepime for HCAP coverage.  Hospitalist was called for admission.  PAST MEDICAL HISTORY:   Past Medical History:  Diagnosis Date  . AAA (abdominal aortic aneurysm) without rupture ([place]) 03/2017   AAA status post endovascular repair Dec 2018  . Allergy   . Atrial fibrillation ([place])    on amiodarone  . Bladder cancer [facility])    s/p cystectomy with external urostomy bag  . Coronary artery disease   . Depression   . H/O aortic valve replacement 2011  . Myocardial infarction ([place])   . Prostate cancer ([place])     PAST SURGICAL HISTORY:   Past Surgical History:  Procedure Laterality Date  . BLADDER SURGERY    . CARDIAC SURGERY    . PORTA CATH INSERTION N/A 09/22/2016   Procedure:  Glori Luis Cath Insertion;  Surgeon: Algernon Huxley, MD;  Location: [place] [facility];  Service: Cardiovascular;  Laterality: N/A;    SOCIAL HISTORY:   Social History   Tobacco Use  . Smoking status: Former Smoker    Packs/day: 0.50    Years: 50.00    Pack years: 25.00  . Smokeless tobacco: Never Used  Substance Use Topics  . Alcohol use: Yes    Comment: very rare    FAMILY HISTORY:   Family History  Problem Relation Age of Onset  . Thyroid disease Mother   . Heart attack Father   . Heart disease Brother   . Parkinson's disease Brother     DRUG ALLERGIES:   Allergies  Allergen Reactions  . Penicillin G Other (See Comments)    REVIEW OF SYSTEMS:   Review of Systems  Constitutional: Positive for chills. Negative for fever.  HENT: Negative for congestion and sore throat.   Eyes: Negative for blurred vision and double vision.  Respiratory: Positive for cough, sputum production, shortness of breath and wheezing.   Cardiovascular: Positive for chest pain. Negative for orthopnea and leg swelling.  Gastrointestinal: Negative for abdominal pain, nausea and vomiting.  Genitourinary: Negative for flank pain and hematuria.  Musculoskeletal: Negative for back pain and neck pain.  Neurological: Negative for dizziness and headaches.  Psychiatric/Behavioral: Negative for depression. The patient is not nervous/anxious.  MEDICATIONS AT HOME:   Prior to Admission medications   Medication Sig Start Date End Date Taking? Authorizing Provider  acidophilus (RISAQUAD) CAPS capsule Take 1 capsule by mouth daily.   Yes [provider]  albuterol (PROVENTIL HFA;VENTOLIN HFA) 108 (90 Base) MCG/ACT inhaler Inhale 1-2 puffs into the lungs every 6 (six) hours as needed for wheezing or shortness of breath.    Yes [provider]  albuterol (PROVENTIL) (2.5 MG/3ML) 0.083% nebulizer solution Take 2.5 mg by nebulization every 6 (six) hours as needed for wheezing or shortness of  breath.   Yes [provider]  ammonium lactate (LAC-HYDRIN) 12 % lotion Apply 1 application topically daily as needed for dry skin.   Yes [provider]  carvedilol (COREG) 12.5 MG tablet Take 12.5 mg by mouth 2 (two) times daily with a meal.    Yes [provider]  diclofenac sodium (VOLTAREN) 1 % GEL Apply 4 g topically 4 (four) times daily as needed (to hands and hips).    Yes [provider]  guaiFENesin (ROBITUSSIN) 100 MG/5ML SOLN Take 5-10 mLs by mouth every 4 (four) hours as needed for cough.   Yes [provider]  Melatonin 3 MG TABS Take 6-9 mg by mouth at bedtime as needed (sleep).   Yes [provider]  nystatin (MYCOSTATIN) 100000 UNIT/ML suspension Take 5 mLs by mouth 3 (three) times daily as needed (recurrent thrush).   Yes [provider]  prochlorperazine (COMPAZINE) 5 MG tablet Take 5 mg by mouth 2 (two) times daily as needed for nausea or vomiting.   Yes [provider]  sennosides-docusate sodium (SENOKOT-S) 8.6-50 MG tablet Take 2 tablets by mouth 2 (two) times daily as needed for constipation.   Yes [provider]  sertraline (ZOLOFT) 25 MG tablet Take 25-50 mg by mouth daily.    Yes [provider]  Tiotropium Bromide-Olodaterol (STIOLTO RESPIMAT) 2.5-2.5 MCG/ACT AERS Inhale 2 puffs into the lungs every morning.   Yes [provider]  traMADol (ULTRAM) 50 MG tablet Take 50-100 mg by mouth 2 (two) times daily as needed for moderate pain.   Yes [provider]  vitamin B-12 (CYANOCOBALAMIN) 1000 MCG tablet Take 1,000 mcg by mouth daily.   Yes [provider]  loperamide (IMODIUM A-D) 2 MG tablet Take 1 tablet (2 mg total) by mouth 4 (four) times daily as needed for diarrhea or loose stools. Patient not taking: Reported on 12/22/2017 09/12/17   Earleen Newport, MD  potassium chloride (K-DUR) 10 MEQ tablet Take 1 tablet (10 mEq total) by mouth daily. Patient  not taking: Reported on 12/08/2017 09/12/17   Earleen Newport, MD      VITAL SIGNS:  Blood pressure 127/76, pulse (!) 101, temperature 98.4 F (36.9 C), temperature source Oral, resp. rate (!) 34, height 5\' 11"  (1.803 m), weight 89.8 kg, SpO2 93 %.  PHYSICAL EXAMINATION:  Physical Exam  GENERAL:  72 y.o.-year-old patient lying in the bed with no acute distress.  EYES: Pupils equal, round, reactive to light and accommodation. No scleral icterus. Extraocular muscles intact.  HEENT: Head atraumatic, normocephalic. Oropharynx and nasopharynx clear.  NECK:  Supple, no jugular venous distention. No thyroid enlargement, no tenderness.  LUNGS: Tachypneic, +right basilar crackles, diffuse mild expiratory wheezing. Mildly increased work of breathing. [place] in place. CARDIOVASCULAR: Tachycardic, regular rhythm, S1, S2 normal. No murmurs, rubs, or gallops.  ABDOMEN: Soft, nontender, nondistended. Bowel sounds present. No organomegaly or mass. +external urostomy bag in  place.  EXTREMITIES: No pedal edema, cyanosis, or clubbing.  NEUROLOGIC: Cranial nerves II through XII are intact. +global weakness. Sensation intact. Gait not checked.  PSYCHIATRIC: The patient is alert and oriented x 3.  SKIN: No obvious rash, lesion, or ulcer.   LABORATORY PANEL:   CBC Recent Labs  Lab 12/14/2017 1542  WBC 14.6*  HGB 8.3*  HCT 26.3*  PLT COUNT MAY BE INACCURATE DUE TO FIBRIN CLUMPS.   ------------------------------------------------------------------------------------------------------------------  Chemistries  Recent Labs  Lab 12/15/2017 1542  NA 139  K 3.4*  CL 99  CO2 25  GLUCOSE 119*  BUN 37*  CREATININE 1.66*  CALCIUM 8.5*  AST 22  ALT 14  ALKPHOS 274*  BILITOT 1.1   ------------------------------------------------------------------------------------------------------------------  Cardiac Enzymes Recent Labs  Lab 12/23/2017 1542  TROPONINI 0.09*    ------------------------------------------------------------------------------------------------------------------  RADIOLOGY:  Dg Chest Port 1 View  Result Date: 11/24/2017 CLINICAL DATA:  72 y/o M; hypotension and shortness of breath today. EXAM: PORTABLE CHEST 1 VIEW COMPARISON:  06/09/2017 CT chest. FINDINGS: Normal cardiac silhouette. Aortic atherosclerosis with calcification. Post median sternotomy with multiple broken wires. Reticular and peripheral linear opacities. No consolidation. Emphysema with upper lobe predominance. No pleural effusion or pneumothorax. No acute osseous abnormality is evident. IMPRESSION: Reticular opacities, likely interstitial edema, possibly atypical pneumonia. Pulmonary nodules are better assessed with CT of the chest when clinically indicated. Electronically Signed   By: Kristine Garbe M.D.   On: 12/11/2017 15:34      IMPRESSION AND PLAN:   Sepsis secondary to HCAP- CXR with possible atypical pneumonia and procalcitonin elevated at 3.69. Tachycardia and leukocytosis on arrival to the ED. Recently admitted to the [place]. - continue vancomycin and cefepime - start solumedrol 60 mg every 6 hours - trend lactic acid - s/p 2L bolus O2, will continue MIVFs - wean O2 as tolerated - cardiac monitoring  Acute exacerbation of COPD- likely related to HCAP. Patient has diffuse wheezing on exam - solumedrol 60mg  every 6 hours - substitute brovana (formulary) for home stiolto - duonebs and albuterol prn  Atypical chest pain- occurs with cough. Unlikely cardiac in etiology. - trend troponins  Hypokalemia- K 3.4 - replete  Hyperglycemia- no history of diabetes - check A1c - will hold off on starting SSI at this time  CAD with hx of MI- stable - continue coreg  Paroxysmal atrial fibrillation- in sinus tachycardia here. Previously on amiodarone, but not taking it anymore. Also not on anticoagulation. Unable to see [place] records for reason why. 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 Management plans discussed with the patient, family and they are in agreement.  CODE STATUS: DNR  TOTAL TIME TAKING CARE OF THIS PATIENT: 45 minutes.    Berna Spare Mayo M.D on 12/15/2017 at 6:51 PM  Between 7am to 6pm - Pager - [phone]  After 6pm go to [email or web address] - Proofreader  [facility]  Office  [phone]  CC: Primary care physician; Gabriel Blackwater, NP   Note: This dictation was prepared with Dragon dictation along with smaller phrase technology. Any transcriptional errors that result from this process are unintentional.

## 2017-12-16 NOTE — ED Provider Notes (Signed)
[facility] Emergency Department Provider Note  ____________________________________________   First MD Initiated Contact with Patient 11/25/2017 1506     (approximate)  I have reviewed the triage vital signs and the nursing notes.   HISTORY  Chief Complaint Chest Pain and Hypotension    HPI Gabriel Robbins is a 72 y.o. male with medical history as listed below which notably includes bladder cancer thought to have spread to multiple other organs including lungs and liver and who is actively undergoing chemotherapy at the [place].  He presents by EMS today for evaluation of central aching chest pain that he describes as "on the surface" and has been going on about a week.  Nothing in particular makes it occur or makes it better.  He says it feels like a pulled muscle.  He says that 911 was called today because "my son became involved" and was concerned about the chest pain.  He denies recent fever/chills, nausea, vomiting, and abdominal pain.  He reports that he occasionally has some shortness of breath but it is not associated with anything in particular including activity.  He does not use oxygen at baseline but EMS noted that his SPO2 was around 90% and they put him on 2 L by nasal cannula for comfort.  He states that he is "100% disabled" and all of his benefits are at the [place].  He lives independently in an apartment but does not get up and around more than he has to because he is afraid he is going to fall.  He has not had any recent falls, however, and denies any recent injuries.  He was last seen in this emergency department about a month ago for some postoperative bleeding after urological surgery.  EMS reports that his blood pressure was low with a systolic around 88 and that his oxygen level was low at about 90%.  His heart rate has been just over 100 since his arrival but he denies any symptoms at this time.  Past Medical History:  Diagnosis Date  . AAA (abdominal  aortic aneurysm) without rupture ([place]) 03/2017   AAA status post endovascular repair Dec 2018  . Allergy   . Atrial fibrillation ([place])    on amiodarone  . Bladder cancer [facility])    s/p cystectomy with external urostomy bag  . Coronary artery disease   . Depression   . H/O aortic valve replacement 2011  . Myocardial infarction ([place])   . Prostate cancer [facility]     Patient Active Problem List   Diagnosis Date Noted  . Bladder cancer ([place]) 09/01/2016    Past Surgical History:  Procedure Laterality Date  . BLADDER SURGERY    . CARDIAC SURGERY    . PORTA CATH INSERTION N/A 09/22/2016   Procedure: Glori Luis Cath Insertion;  Surgeon: Algernon Huxley, MD;  Location: [place] CV LAB;  Service: Cardiovascular;  Laterality: N/A;    Prior to Admission medications   Medication Sig Start Date End Date Taking? Authorizing Provider  acetaminophen (TYLENOL) 325 MG tablet Take 650 mg by mouth 2 (two) times daily as needed for mild pain or moderate pain.    [provider]  albuterol (PROVENTIL HFA;VENTOLIN HFA) 108 (90 Base) MCG/ACT inhaler Inhale 2 puffs into the lungs every 4 (four) hours as needed for wheezing or shortness of breath.    [provider]  ammonium lactate (LAC-HYDRIN) 12 % lotion Apply 1 application topically daily as needed for dry skin.    [provider]  atorvastatin (LIPITOR) 40 MG tablet Take 80 mg by mouth at bedtime.     [provider]  carvedilol (COREG) 25 MG tablet Take 25 mg by mouth 2 (two) times daily with a meal.     [provider]  cetirizine (ZYRTEC) 10 MG tablet Take 10 mg by mouth daily.    [provider]  diclofenac sodium (VOLTAREN) 1 % GEL Apply 1 application topically daily as needed.    [provider]  fluticasone (FLONASE) 50 MCG/ACT nasal spray Place 2 sprays into both nostrils daily as needed for allergies or rhinitis.    [provider]  loperamide (IMODIUM A-D) 2 MG tablet Take 1  tablet (2 mg total) by mouth 4 (four) times daily as needed for diarrhea or loose stools. 09/12/17   Earleen Newport, MD  polyvinyl alcohol (LIQUIFILM TEARS) 1.4 % ophthalmic solution Place 1 drop into both eyes daily as needed for dry eyes.    [provider]  potassium chloride (K-DUR) 10 MEQ tablet Take 1 tablet (10 mEq total) by mouth daily. 09/12/17   Earleen Newport, MD  promethazine (PHENERGAN) 50 MG tablet Take 50 mg by mouth every 6 (six) hours as needed for nausea or vomiting.    [provider]  sertraline (ZOLOFT) 25 MG tablet Take 25 mg by mouth at bedtime.    [provider]  tamsulosin (FLOMAX) 0.4 MG CAPS capsule Take 0.4 mg by mouth daily.     [provider]  Tiotropium Bromide-Olodaterol (STIOLTO RESPIMAT) 2.5-2.5 MCG/ACT AERS Inhale 2 puffs into the lungs every morning.    [provider]  vitamin B-12 (CYANOCOBALAMIN) 1000 MCG tablet Take 1,000 mcg by mouth daily.    [provider]  zolpidem (AMBIEN) 5 MG tablet Take 2.5 mg by mouth at bedtime as needed for sleep (VERY RARELY TAKES).    [provider]    Allergies Penicillin g  Family History  Problem Relation Age of Onset  . Thyroid disease Mother   . Heart attack Father   . Heart disease Brother   . Parkinson's disease Brother     Social History Social History   Tobacco Use  . Smoking status: Former Smoker    Packs/day: 0.50    Years: 50.00    Pack years: 25.00  . Smokeless tobacco: Never Used  Substance Use Topics  . Alcohol use: Yes    Comment: very rare  . Drug use: No    Review of Systems Constitutional: No fever/chills Eyes: No visual changes. ENT: No sore throat. Cardiovascular: Mild anterior aching chest pain for about a week as described above Respiratory: Occasional shortness of breath. Gastrointestinal: No abdominal pain.  No nausea, no vomiting.  No diarrhea.  No constipation. Genitourinary: Negative for  dysuria. Musculoskeletal: Negative for neck pain.  Negative for back pain. Integumentary: Negative for rash. Neurological: Negative for headaches, focal weakness or numbness.   ____________________________________________   PHYSICAL EXAM:  VITAL SIGNS: ED Triage Vitals [12/12/2017 1507]  Enc Vitals Group     BP (!) 93/46     Pulse Rate (!) 101     Resp (!) 25     Temp 98.4 F (36.9 C)     Temp Source Oral     SpO2 93 %     Weight 89.8 kg (198 lb)     Height 1.803 m (5\' 11" )     Head Circumference      Peak Flow  Pain Score 3     Pain Loc      Pain Edu?      Excl. in [place]?     Constitutional: Alert and oriented.  Appears chronically ill but has no complaints at this time other than intermittent chest pain as described above but is not currently in pain. Eyes: Conjunctivae are normal.  Head: Atraumatic. Nose: No congestion/rhinnorhea. Mouth/Throat: Mucous membranes are moist. Neck: No stridor.  No meningeal signs.   Cardiovascular: Mild tachycardia, regular rhythm. Good peripheral circulation. Grossly normal heart sounds. Respiratory: Normal respiratory effort.  No retractions. Lungs CTAB. Gastrointestinal: Abdomen is somewhat distended but soft and nontender.  He has an external urostomy bag with essentially normal-appearing urine. Musculoskeletal: No lower extremity tenderness nor edema. No gross deformities of extremities. Neurologic:  Normal speech and language. No gross focal neurologic deficits are appreciated.  Skin:  Skin is pale, warm, dry and intact. No rash noted. Psychiatric: Mood and affect are normal. Speech and behavior are normal.  ____________________________________________   LABS (all labs ordered are listed, but only abnormal results are displayed)  Labs Reviewed  COMPREHENSIVE METABOLIC PANEL - Abnormal; Notable for the following components:      Result Value   Potassium 3.4 (*)    Glucose, Bld 119 (*)    BUN 37 (*)    Creatinine, Ser 1.66  (*)    Calcium 8.5 (*)    Total Protein 6.1 (*)    Albumin 2.4 (*)    Alkaline Phosphatase 274 (*)    GFR calc non Af Amer 40 (*)    GFR calc Af Amer 46 (*)    All other components within normal limits  BRAIN NATRIURETIC PEPTIDE - Abnormal; Notable for the following components:   B Natriuretic Peptide 170.0 (*)    All other components within normal limits  TROPONIN I - Abnormal; Notable for the following components:   Troponin I 0.09 (*)    All other components within normal limits  CBC WITH DIFFERENTIAL/PLATELET - Abnormal; Notable for the following components:   WBC 14.6 (*)    RBC 3.50 (*)    Hemoglobin 8.3 (*)    HCT 26.3 (*)    MCV 75.0 (*)    MCH 23.6 (*)    MCHC 31.5 (*)    RDW 18.8 (*)    Neutro Abs 13.9 (*)    Lymphs Abs 0.4 (*)    All other components within normal limits  URINE CULTURE  CULTURE, BLOOD (ROUTINE X 2)  CULTURE, BLOOD (ROUTINE X 2)  ETHANOL  LIPASE, BLOOD  LACTIC ACID, PLASMA  LACTIC ACID, PLASMA  URINALYSIS, COMPLETE (UACMP) WITH MICROSCOPIC  PROCALCITONIN   ____________________________________________  EKG  ED ECG REPORT I, Hinda Kehr, the attending physician, personally viewed and interpreted this ECG.  Date: 12/05/2017 EKG Time: 15: 07 Rate: 100 Rhythm: Sinus tachycardia QRS Axis: Borderline right axis deviation Intervals: normal with occasional PVC ST/T Wave abnormalities: Non-specific ST segment / T-wave changes, but no evidence of acute ischemia. Narrative Interpretation: no evidence of acute ischemia   ____________________________________________  RADIOLOGY I, Hinda Kehr, personally viewed and evaluated these images (plain radiographs) as part of my medical decision making, as well as reviewing the written report by the radiologist.  ED MD interpretation:  Atypical pneumonia  Official radiology report(s): Dg Chest Port 1 View  Result Date: 12/19/2017 CLINICAL DATA:  72 y/o M; hypotension and shortness of breath today.  EXAM: PORTABLE CHEST 1 VIEW COMPARISON:  06/09/2017 CT chest. FINDINGS:  Normal cardiac silhouette. Aortic atherosclerosis with calcification. Post median sternotomy with multiple broken wires. Reticular and peripheral linear opacities. No consolidation. Emphysema with upper lobe predominance. No pleural effusion or pneumothorax. No acute osseous abnormality is evident. IMPRESSION: Reticular opacities, likely interstitial edema, possibly atypical pneumonia. Pulmonary nodules are better assessed with CT of the chest when clinically indicated. Electronically Signed   By: Kristine Garbe M.D.   On: 12/15/2017 15:34    ____________________________________________   PROCEDURES  Critical Care performed: Yes, see critical care procedure note(s)   Procedure(s) performed:   .Critical Care Performed by: Hinda Kehr, MD Authorized by: Hinda Kehr, MD   Critical care provider statement:    Critical care time was exclusive of:  Separately billable procedures and treating other patients   Critical care was necessary to treat or prevent imminent or life-threatening deterioration of the following conditions:  Sepsis   Critical care was time spent personally by me on the following activities:  Development of treatment plan with patient or surrogate, discussions with consultants, evaluation of patient's response to treatment, examination of patient, obtaining history from patient or surrogate, ordering and performing treatments and interventions, ordering and review of laboratory studies, ordering and review of radiographic studies, pulse oximetry, re-evaluation of patient's condition and review of old charts     ____________________________________________   INITIAL IMPRESSION / [place] / ED COURSE  As part of my medical decision making, I reviewed the following data within the electronic MEDICAL RECORD NUMBER History obtained from family, Nursing notes reviewed and incorporated, Labs  reviewed , EKG interpreted , Old chart reviewed, Discussed with admitting physician  and Notes from prior ED visits    Differential diagnosis includes, but is not limited to, urinary tract infection, pneumonia, intra-abdominal abscess or infection.  Less likely intracranial bleeding, CVA, ACS.  The patient could have a pulled muscle as he describes in his chest wall causing the week long discomfort he is experiencing but it is not reproducible with palpation or with movement.  His vital signs are borderline at best with borderline hypotension, mild tachycardia and low SPO2 but not quite hypoxemic.  He does not in my opinion appear septic at this time but I am initiating a broad laboratory work-up without calling "code sepsis".  I will provide 1 L normal saline IV fluids for his hypotension and mild tachycardia, both of which could be volume related; the patient reports not eating or drinking very much over an extended period of time.  I will reassess after liter of fluids and laboratory values are back.  Chest x-ray is pending as well given his borderline hypoxemia and to report of occasional shortness of breath.  EKG was unremarkable with no evidence of acute ischemia.  Clinical Course as of Dec 17 1710  Fri Dec 16, 2017  1650 Lab work is notable for an elevated creatinine from baseline at 1.66 and a BUN of 37 likely indicating acute kidney injury in the setting of decreased oral intake.  Potassium is only slightly decreased to 3.4.  CBC is notable for a leukocytosis of 14.6 and hemoglobin of 8.3.  He has a very slightly elevated BNP but more notably a troponin of 0.09 with no history of troponin elevation.  I think this is likely demand ischemia in the setting of acute infection.  Chest x-ray suggests atypical pneumonia and this would be consistent with his presentation including some tachycardia, hypotension, tachypnea, borderline hypoxemia.  I have initiated code sepsis at this point and  have ordered  empiric antibiotics for hospital-acquired pneumonia given his multiple recent hospitalizations and procedures; he is getting cefepime 2 g IV and vancomycin 1 g IV.I discussed all this with the patient and his family.  He receives all of his care at the [facility] and I am contacting them see if they are on diversion or if they would be available to take him as a patient.  He is getting a second liter normal saline because he is still borderline hypotensive   [CF]  1703 [facility]  I will discuss the case with the hospitalist at this facility.   [CF]  1710 Discussed with Dr. Jerelyn Charles who will admit.   [CF]    Clinical Course User Index [CF] Hinda Kehr, MD    ____________________________________________  FINAL CLINICAL IMPRESSION(S) / ED DIAGNOSES  Final diagnoses:  Sepsis, due to unspecified organism [facility])  Atypical pneumonia  Acute kidney injury ([place])  Demand ischemia ([place])  Chest pain, unspecified type  History of bladder cancer     MEDICATIONS GIVEN DURING THIS VISIT:  Medications  sodium chloride 0.9 % bolus 1,000 mL (has no administration in time range)  vancomycin (VANCOCIN) IVPB 1000 mg/200 mL premix (has no administration in time range)  ceFEPIme (MAXIPIME) 2 g in sodium chloride 0.9 % 100 mL IVPB (has no administration in time range)  sodium chloride 0.9 % bolus 1,000 mL (1,000 mLs Intravenous New Bag/Given 12/22/2017 1551)     ED Discharge Orders    None       Note:  This document was prepared using Dragon voice recognition software and may include unintentional dictation errors.    Hinda Kehr, MD 12/06/2017 [phone]

## 2017-12-16 NOTE — Progress Notes (Signed)
Pharmacy Antibiotic Note  Gabriel Robbins is a 72 y.o. male admitted on 12/21/2017 with pneumonia.  Pharmacy has been consulted for vancomycin and cefepime dosing. Pt's SCr is currently 1.66. Baseline SCr appears to be 0.8-0.9. Pt received vancomycin 1 g x1 and cefepime 2g x1 in the ED.   Plan: Vancomycin 1250 mg  IV every 18 hours.  Goal trough 15-20 mcg/mL.  Anticipate renal function will improve with fluids. If renal function worsens, will likely need to extend interval or decrease dose. F/u BMET in AM.   Ke 0.041, half life 17 h, Vd 63 L   Cefepime 2 g IV q12h based on current renal function  Will order MRSA PCR   Height: 5\' 11"  (180.3 cm) Weight: 198 lb (89.8 kg) IBW/kg (Calculated) : 75.3  Temp (24hrs), Avg:98.4 F (36.9 C), Min:98.4 F (36.9 C), Max:98.4 F (36.9 C)  Recent Labs  Lab 11/29/2017 1542 12/23/2017 1740  WBC 14.6*  --   CREATININE 1.66*  --   LATICACIDVEN  --  3.4*    Estimated Creatinine Clearance: 43.5 mL/min (A) (by C-G formula based on SCr of 1.66 mg/dL (H)).    Allergies  Allergen Reactions  . Penicillin G Other (See Comments)    Antimicrobials this admission: Vancomycin/cefepime 8/23>>  Dose adjustments this admission:   Microbiology results: 8/23  BCx: sent 8/23 UCx: ordered  8/23 MRSA PCR: ordered  Thank you for allowing pharmacy to be a part of this patient's care.  Rocky Morel 12/19/2017 7:45 PM

## 2017-12-16 NOTE — Progress Notes (Signed)
CODE SEPSIS - PHARMACY COMMUNICATION  **[facility] Antibiotics should be administered within 1 hour of Sepsis diagnosis**  Time Code Sepsis Called/Page Received: 1656  Antibiotics Ordered: vanc/cefepime at 1650  Time of 1st antibiotic administration: cefepime - 1739  Additional action taken by pharmacy: Called RN Devetta at 1736, RN was in room  If necessary, Name of Provider/Nurse Contacted: see above    Rocky Morel ,PharmD Clinical Pharmacist  12/04/2017  4:59 PM

## 2017-12-17 ENCOUNTER — Inpatient Hospital Stay: Payer: Medicare Other

## 2017-12-17 DIAGNOSIS — J309 Allergic rhinitis, unspecified: Secondary | ICD-10-CM | POA: Diagnosis present

## 2017-12-17 DIAGNOSIS — D630 Anemia in neoplastic disease: Secondary | ICD-10-CM | POA: Diagnosis present

## 2017-12-17 DIAGNOSIS — C679 Malignant neoplasm of bladder, unspecified: Secondary | ICD-10-CM | POA: Diagnosis present

## 2017-12-17 DIAGNOSIS — C787 Secondary malignant neoplasm of liver and intrahepatic bile duct: Secondary | ICD-10-CM | POA: Diagnosis present

## 2017-12-17 DIAGNOSIS — C7800 Secondary malignant neoplasm of unspecified lung: Secondary | ICD-10-CM | POA: Diagnosis present

## 2017-12-17 DIAGNOSIS — I2510 Atherosclerotic heart disease of native coronary artery without angina pectoris: Secondary | ICD-10-CM | POA: Diagnosis present

## 2017-12-17 DIAGNOSIS — Z87891 Personal history of nicotine dependence: Secondary | ICD-10-CM

## 2017-12-17 DIAGNOSIS — Z7951 Long term (current) use of inhaled steroids: Secondary | ICD-10-CM | POA: Diagnosis not present

## 2017-12-17 DIAGNOSIS — A419 Sepsis, unspecified organism: Secondary | ICD-10-CM | POA: Diagnosis not present

## 2017-12-17 DIAGNOSIS — G9340 Encephalopathy, unspecified: Secondary | ICD-10-CM | POA: Diagnosis present

## 2017-12-17 DIAGNOSIS — Z8551 Personal history of malignant neoplasm of bladder: Secondary | ICD-10-CM | POA: Diagnosis not present

## 2017-12-17 DIAGNOSIS — I472 Ventricular tachycardia: Secondary | ICD-10-CM | POA: Diagnosis present

## 2017-12-17 DIAGNOSIS — I482 Chronic atrial fibrillation: Secondary | ICD-10-CM

## 2017-12-17 DIAGNOSIS — D509 Iron deficiency anemia, unspecified: Secondary | ICD-10-CM

## 2017-12-17 DIAGNOSIS — R7881 Bacteremia: Secondary | ICD-10-CM | POA: Diagnosis not present

## 2017-12-17 DIAGNOSIS — F329 Major depressive disorder, single episode, unspecified: Secondary | ICD-10-CM

## 2017-12-17 DIAGNOSIS — Z8349 Family history of other endocrine, nutritional and metabolic diseases: Secondary | ICD-10-CM | POA: Diagnosis not present

## 2017-12-17 DIAGNOSIS — A4159 Other Gram-negative sepsis: Secondary | ICD-10-CM | POA: Diagnosis present

## 2017-12-17 DIAGNOSIS — R410 Disorientation, unspecified: Secondary | ICD-10-CM

## 2017-12-17 DIAGNOSIS — J9601 Acute respiratory failure with hypoxia: Secondary | ICD-10-CM | POA: Diagnosis present

## 2017-12-17 DIAGNOSIS — Z8546 Personal history of malignant neoplasm of prostate: Secondary | ICD-10-CM | POA: Diagnosis not present

## 2017-12-17 DIAGNOSIS — Z66 Do not resuscitate: Secondary | ICD-10-CM | POA: Diagnosis present

## 2017-12-17 DIAGNOSIS — B961 Klebsiella pneumoniae [K. pneumoniae] as the cause of diseases classified elsewhere: Secondary | ICD-10-CM | POA: Diagnosis not present

## 2017-12-17 DIAGNOSIS — Z952 Presence of prosthetic heart valve: Secondary | ICD-10-CM | POA: Diagnosis not present

## 2017-12-17 DIAGNOSIS — Z7189 Other specified counseling: Secondary | ICD-10-CM | POA: Diagnosis not present

## 2017-12-17 DIAGNOSIS — Z8249 Family history of ischemic heart disease and other diseases of the circulatory system: Secondary | ICD-10-CM | POA: Diagnosis not present

## 2017-12-17 DIAGNOSIS — C7931 Secondary malignant neoplasm of brain: Secondary | ICD-10-CM | POA: Diagnosis present

## 2017-12-17 DIAGNOSIS — R079 Chest pain, unspecified: Secondary | ICD-10-CM | POA: Diagnosis present

## 2017-12-17 DIAGNOSIS — C799 Secondary malignant neoplasm of unspecified site: Secondary | ICD-10-CM

## 2017-12-17 DIAGNOSIS — Z8679 Personal history of other diseases of the circulatory system: Secondary | ICD-10-CM | POA: Diagnosis not present

## 2017-12-17 DIAGNOSIS — Y95 Nosocomial condition: Secondary | ICD-10-CM | POA: Diagnosis present

## 2017-12-17 DIAGNOSIS — J189 Pneumonia, unspecified organism: Secondary | ICD-10-CM | POA: Diagnosis present

## 2017-12-17 DIAGNOSIS — I248 Other forms of acute ischemic heart disease: Secondary | ICD-10-CM | POA: Diagnosis present

## 2017-12-17 DIAGNOSIS — J440 Chronic obstructive pulmonary disease with acute lower respiratory infection: Secondary | ICD-10-CM | POA: Diagnosis present

## 2017-12-17 DIAGNOSIS — N179 Acute kidney failure, unspecified: Secondary | ICD-10-CM | POA: Diagnosis present

## 2017-12-17 DIAGNOSIS — D696 Thrombocytopenia, unspecified: Secondary | ICD-10-CM

## 2017-12-17 DIAGNOSIS — Z515 Encounter for palliative care: Secondary | ICD-10-CM | POA: Diagnosis not present

## 2017-12-17 DIAGNOSIS — J441 Chronic obstructive pulmonary disease with (acute) exacerbation: Secondary | ICD-10-CM | POA: Diagnosis present

## 2017-12-17 DIAGNOSIS — R0902 Hypoxemia: Secondary | ICD-10-CM

## 2017-12-17 LAB — BASIC METABOLIC PANEL
ANION GAP: 8 (ref 5–15)
BUN: 32 mg/dL — ABNORMAL HIGH (ref 8–23)
CALCIUM: 7.6 mg/dL — AB (ref 8.9–10.3)
CO2: 25 mmol/L (ref 22–32)
Chloride: 110 mmol/L (ref 98–111)
Creatinine, Ser: 1.5 mg/dL — ABNORMAL HIGH (ref 0.61–1.24)
GFR, EST AFRICAN AMERICAN: 52 mL/min — AB (ref >60)
GFR, EST NON AFRICAN AMERICAN: 45 mL/min — AB (ref >60)
GLUCOSE: 134 mg/dL — AB (ref 70–99)
Potassium: 4 mmol/L (ref 3.5–5.1)
SODIUM: 143 mmol/L (ref 135–145)

## 2017-12-17 LAB — CBC
HCT: 20.2 % — ABNORMAL LOW (ref 40.0–52.0)
HEMOGLOBIN: 6.6 g/dL — AB (ref 13.0–18.0)
MCH: 24.7 pg — ABNORMAL LOW (ref 26.0–34.0)
MCHC: 32.8 g/dL (ref 32.0–36.0)
MCV: 75.3 fL — ABNORMAL LOW (ref 80.0–100.0)
Platelets: DECREASED 10*3/uL (ref 150–440)
RBC: 2.69 MIL/uL — AB (ref 4.40–5.90)
RDW: 18.4 % — ABNORMAL HIGH (ref 11.5–14.5)
WBC: 10.2 10*3/uL (ref 3.8–10.6)

## 2017-12-17 LAB — BLOOD CULTURE ID PANEL (REFLEXED)
Acinetobacter baumannii: NOT DETECTED
CANDIDA PARAPSILOSIS: NOT DETECTED
CARBAPENEM RESISTANCE: NOT DETECTED
Candida albicans: NOT DETECTED
Candida glabrata: NOT DETECTED
Candida krusei: NOT DETECTED
Candida tropicalis: NOT DETECTED
ENTEROBACTERIACEAE SPECIES: DETECTED — AB
Enterobacter cloacae complex: NOT DETECTED
Enterococcus species: NOT DETECTED
Escherichia coli: NOT DETECTED
HAEMOPHILUS INFLUENZAE: NOT DETECTED
KLEBSIELLA PNEUMONIAE: DETECTED — AB
Klebsiella oxytoca: NOT DETECTED
Listeria monocytogenes: NOT DETECTED
Neisseria meningitidis: NOT DETECTED
PSEUDOMONAS AERUGINOSA: NOT DETECTED
Proteus species: NOT DETECTED
STAPHYLOCOCCUS AUREUS BCID: NOT DETECTED
STAPHYLOCOCCUS SPECIES: NOT DETECTED
STREPTOCOCCUS PYOGENES: NOT DETECTED
STREPTOCOCCUS SPECIES: NOT DETECTED
Serratia marcescens: NOT DETECTED
Streptococcus agalactiae: NOT DETECTED
Streptococcus pneumoniae: NOT DETECTED

## 2017-12-17 LAB — TROPONIN I: TROPONIN I: 0.08 ng/mL — AB (ref <0.03)

## 2017-12-17 LAB — MRSA PCR SCREENING: MRSA by PCR: NEGATIVE

## 2017-12-17 LAB — LACTATE DEHYDROGENASE: LDH: 730 U/L — ABNORMAL HIGH (ref 98–192)

## 2017-12-17 LAB — HEPATIC FUNCTION PANEL
ALK PHOS: 240 U/L — AB (ref 38–126)
ALT: 12 U/L (ref 0–44)
AST: 24 U/L (ref 15–41)
Albumin: 2 g/dL — ABNORMAL LOW (ref 3.5–5.0)
Bilirubin, Direct: 0.2 mg/dL (ref 0.0–0.2)
Indirect Bilirubin: 0.3 mg/dL (ref 0.3–0.9)
TOTAL PROTEIN: 5.5 g/dL — AB (ref 6.5–8.1)
Total Bilirubin: 0.5 mg/dL (ref 0.3–1.2)

## 2017-12-17 LAB — TECHNOLOGIST SMEAR REVIEW

## 2017-12-17 LAB — PREPARE RBC (CROSSMATCH)

## 2017-12-17 LAB — HEMOGLOBIN A1C
HEMOGLOBIN A1C: 6 % — AB (ref 4.8–5.6)
Mean Plasma Glucose: 125.5 mg/dL

## 2017-12-17 LAB — ABO/RH: ABO/RH(D): O POS

## 2017-12-17 LAB — FOLATE: FOLATE: 5.3 ng/mL — AB (ref >5.9)

## 2017-12-17 MED ORDER — OXYCODONE-ACETAMINOPHEN 5-325 MG PO TABS: 2.0000 | ORAL_TABLET | Freq: Four times a day (QID) | ORAL | Status: DC | PRN

## 2017-12-17 MED ORDER — NYSTATIN 100000 UNIT/ML MT SUSP: 5.0000 mL | Freq: Four times a day (QID) | OROMUCOSAL | Status: DC

## 2017-12-17 MED ORDER — LORAZEPAM 0.5 MG PO TABS
0.5000 mg | ORAL_TABLET | Freq: Four times a day (QID) | ORAL | Status: DC | PRN
  Administered 2017-12-17 – 2017-12-18 (×2): 0.5 mg via ORAL
  Filled 2017-12-17 (×2): qty 1

## 2017-12-17 MED ORDER — NYSTATIN 100000 UNIT/ML MT SUSP
5.0000 mL | Freq: Four times a day (QID) | OROMUCOSAL | Status: DC
  Administered 2017-12-17 – 2017-12-18 (×4): 500000 [IU] via OROMUCOSAL
  Filled 2017-12-17 (×3): qty 5

## 2017-12-17 MED ORDER — SODIUM CHLORIDE 0.9 % IV SOLN
2.0000 g | Freq: Every day | INTRAVENOUS | Status: DC
  Administered 2017-12-17: 2 g via INTRAVENOUS
  Filled 2017-12-17: qty 2
  Filled 2017-12-17: qty 20

## 2017-12-17 MED ORDER — SODIUM CHLORIDE 0.9% IV SOLUTION
Freq: Once | INTRAVENOUS | Status: AC
  Administered 2017-12-17: 22:00:00 via INTRAVENOUS

## 2017-12-17 NOTE — Progress Notes (Signed)
OT Cancellation Note  Patient Details Name: Gabriel Robbins MRN: [id number] DOB: Nov 21, 1945   Cancelled Treatment:    Reason Eval/Treat Not Completed: Medical issues which prohibited therapy(Pt. Hgb is trending down to 6.6 which is contraindicated for OT intervention at this time. Will conitnue to monitor as intervene when medically appropriate.)  Harrel Carina, MS, OTR/L 12/17/2017, 9:46 AM

## 2017-12-17 NOTE — Progress Notes (Signed)
CRITICAL VALUE STICKER  CRITICAL VALUE: Platelets 20  RECEIVER (on-site recipient of call):Kongmeng Santoro Woodson NOTIFIED: [phone], 12/17/17  MESSENGER (representative from lab):  MD NOTIFIED: paged  [name], 12/17/17  RESPONSE: pending

## 2017-12-17 NOTE — Consult Note (Addendum)
Hematology/Oncology Consult note [facility] Telephone:([phone] [phone]  Patient Care Team: [name], NP as PCP - General (Family Medicine)   Name of the patient: Gabriel Robbins  [id number]  Sep 19, 1945   Date of visit: 12/17/17 REASON FOR COSULTATION:  Thrombocytopenia History of presenting illness-  72 y.o. male with history of metastatic bladder cancer, chronic atrial fibrillation, CAD, aortic valve replacement, AAA repair, depression who presented to emergency room for evaluation of shortness of breath.  In the emergency room patient meet sirs criteria with a heart rate of 101, WBC 14.6, hypoxic, lactic acid was 3.4, procalcitonin 3.69.  Chest x-ray showed interstitial edema and possible atypical pneumonia.  He was started on broad-spectrum IV antibiotics for H CAP coverage. It was noted that patient has thrombocytopenia.  CBC obtained on 12/22/2017 showed clumped platelet. 12/17/2017 CBC showed platelet count was 20,000.  Confirmed with smear.  Hemoglobin was 6.6, MCV 75.3. Patient previously followed up with Dr. Grayland Ormond for treatment of bladder cancer.  Last seen in January thousand 19.  After that patient follows up with [facility] Patient's son Gerald Stabs was at bedside.  Per son, patient has been recently on immunotherapy.  Last infusion approximately 2 weeks ago. Recent scan showed that patient has disease progression including CNS metastasis.  Medical records not available at this point. No recent baseline platelet counts available.  Patient reports feeling tired.  Lack of appetite.  He has lower back pain which is chronic, worsening.  Denies any headache or change of vision. Review of Systems  Constitutional: Positive for fever. Negative for malaise/fatigue and weight loss.  HENT: Negative for sore throat.   Eyes: Negative for pain.  Respiratory: Positive for shortness of breath.   Cardiovascular: Negative for chest pain.    Gastrointestinal: Negative for abdominal pain, nausea and vomiting.  Genitourinary: Negative for flank pain.  Musculoskeletal: Negative for falls.  Skin: Negative for rash.  Neurological: Negative for headaches.  Endo/Heme/Allergies: Bruises/bleeds easily.  Psychiatric/Behavioral: Negative for hallucinations.    Allergies  Allergen Reactions  . Penicillin G Other (See Comments)    Patient Active Problem List   Diagnosis Date Noted  . Bacteremia 12/17/2017  . HCAP (healthcare-associated pneumonia) 12/14/2017  . Ventricular tachycardia ([place]) 11/28/2017  . Bladder cancer ([place]) 09/01/2016     Past Medical History:  Diagnosis Date  . AAA (abdominal aortic aneurysm) without rupture ([place]) 03/2017   AAA status post endovascular repair Dec 2018  . Allergy   . Atrial fibrillation ([place])    on amiodarone  . Bladder cancer [facility])    s/p cystectomy with external urostomy bag  . Coronary artery disease   . Depression   . H/O aortic valve replacement 2011  . Myocardial infarction ([place])   . Prostate cancer [facility])      Past Surgical History:  Procedure Laterality Date  . BLADDER SURGERY    . CARDIAC SURGERY    . PORTA CATH INSERTION N/A 09/22/2016   Procedure: Glori Luis Cath Insertion;  Surgeon: Algernon Huxley, MD;  Location: [facility];  Service: Cardiovascular;  Laterality: N/A;    Social History   Socioeconomic History  . Marital status: Divorced    Spouse name: Not on file  . Number of children: Not on file  . Years of education: Not on file  . Highest education level: Not on file  Occupational History  . Not on file  Social Needs  . Financial resource strain: Not hard at all  .  Food insecurity:    Worry: Never true    Inability: Never true  . Transportation needs:    Medical: No    Non-medical: No  Tobacco Use  . Smoking status: Former Smoker    Packs/day: 0.50    Years: 50.00    Pack years: 25.00  . Smokeless tobacco: Never Used  Substance and Sexual  Activity  . Alcohol use: Yes    Comment: very rare  . Drug use: No  . Sexual activity: Not Currently  Lifestyle  . Physical activity:    Days per week: 0 days    Minutes per session: 0 min  . Stress: Not at all  Relationships  . Social connections:    Talks on phone: Once a week    Gets together: Once a week    Attends religious service: Never    Active member of club or organization: No    Attends meetings of clubs or organizations: Never    Relationship status: Divorced  . Intimate partner violence:    Fear of current or ex partner: No    Emotionally abused: No    Physically abused: No    Forced sexual activity: No  Other Topics Concern  . Not on file  Social History Narrative   Lives alone, home in past     Family History  Problem Relation Age of Onset  . Thyroid disease Mother   . Heart attack Father   . Heart disease Brother   . Parkinson's disease Brother      Current Facility-Administered Medications:  .  0.9 %  sodium chloride infusion, , Intravenous, Continuous, Mayo, Pete Pelt, MD, Last Rate: 125 mL/hr at 12/17/17 0137 .  acetaminophen (TYLENOL) tablet 650 mg, 650 mg, Oral, Q6H PRN **OR** acetaminophen (TYLENOL) suppository 650 mg, 650 mg, Rectal, Q6H PRN, Mayo, Pete Pelt, MD .  acidophilus (RISAQUAD) capsule 1 capsule, 1 capsule, Oral, Daily, Mayo, Pete Pelt, MD, 1 capsule at 12/17/17 0925 .  albuterol (PROVENTIL) (2.5 MG/3ML) 0.083% nebulizer solution 2.5 mg, 2.5 mg, Inhalation, Q6H PRN, Mayo, Pete Pelt, MD .  arformoterol [facility]) nebulizer solution 15 mcg, 15 mcg, Nebulization, BID, Mayo, Pete Pelt, MD, 15 mcg at 12/17/17 0756 .  carvedilol (COREG) tablet 12.5 mg, 12.5 mg, Oral, BID WC, Mayo, Pete Pelt, MD .  cefTRIAXone (ROCEPHIN) 2 g in sodium chloride 0.9 % 100 mL IVPB, 2 g, Intravenous, Daily, Sudini, Srikar, MD .  diclofenac sodium (VOLTAREN) 1 % transdermal gel 4 g, 4 g, Topical, QID PRN, Mayo, Pete Pelt, MD .  guaiFENesin (ROBITUSSIN) 100 MG/5ML  solution 100-200 mg, 5-10 mL, Oral, Q4H PRN, Mayo, Pete Pelt, MD, 100 mg at 12/23/2017 2318 .  ipratropium-albuterol (DUONEB) 0.5-2.5 (3) MG/3ML nebulizer solution 3 mL, 3 mL, Nebulization, Q6H, Mayo, Pete Pelt, MD, 3 mL at 12/17/17 0749 .  Melatonin TABS 5-10 mg, 5-10 mg, Oral, QHS PRN, Mayo, Pete Pelt, MD .  methylPREDNISolone sodium succinate (SOLU-MEDROL) 125 mg/2 mL injection 60 mg, 60 mg, Intravenous, Q6H, Mayo, Pete Pelt, MD, 60 mg at 12/17/17 0925 .  morphine 4 MG/ML injection 4 mg, 4 mg, Intravenous, Q3H PRN, Mayo, Pete Pelt, MD, 4 mg at 12/02/2017 2228 .  [START ON 02/22/2018] nystatin (MYCOSTATIN) 100000 UNIT/ML suspension 500,000 Units, 5 mL, Mouth/Throat, QID, Sudini, Srikar, MD .  prochlorperazine (COMPAZINE) tablet 5 mg, 5 mg, Oral, BID PRN, Mayo, Pete Pelt, MD .  senna-docusate (Senokot-S) tablet 2 tablet, 2 tablet, Oral, BID PRN, Mayo, Pete Pelt, MD .  sertraline (  ZOLOFT) tablet 25 mg, 25 mg, Oral, Daily, Mayo, Pete Pelt, MD, 25 mg at 12/17/17 7035  Facility-Administered Medications Ordered in Other Encounters:  .  heparin lock flush 100 unit/mL, 500 Units, Intravenous, Once, Finnegan, Kathlene November, MD .  sodium chloride flush (NS) 0.9 % injection 10 mL, 10 mL, Intravenous, PRN, [name], MD, 10 mL at 02/02/17 0851   Physical exam:  Vitals:   12/04/2017 2156 12/17/17 0133 12/17/17 0441 12/17/17 0749  BP: (!) 94/57 (!) 90/58 (!) 113/49   Pulse: (!) 111 (!) 102 98   Resp: 19 18 18    Temp: 98.6 F (37 C) 98.6 F (37 C) 97.7 F (36.5 C)   TempSrc: Oral Oral Oral   SpO2: 98% 93% 95% 93%  Weight: 201 lb 9.6 oz (91.4 kg)     Height: 5\' 11"  (1.803 m)      Physical Exam  Constitutional: No distress.  HENT:  Head: Normocephalic and atraumatic.  Eyes: Pupils are equal, round, and reactive to light. EOM are normal.  Neck: Neck supple.  Cardiovascular: Normal rate.  Murmur heard. Pulmonary/Chest: Effort normal. No respiratory distress.  Abdominal: Soft. Bowel sounds are  normal.  Musculoskeletal: He exhibits no edema.  Neurological: He is alert. No cranial nerve deficit.  Skin: Skin is warm and dry. No rash noted.  Psychiatric: Affect normal.        CMP Latest Ref Rng & Units 12/17/2017  Glucose 70 - 99 mg/dL 134(H)  BUN 8 - 23 mg/dL 32(H)  Creatinine 0.61 - 1.24 mg/dL 1.50(H)  Sodium 135 - 145 mmol/L 143  Potassium 3.5 - 5.1 mmol/L 4.0  Chloride 98 - 111 mmol/L 110  CO2 22 - 32 mmol/L 25  Calcium 8.9 - 10.3 mg/dL 7.6(L)  Total Protein 6.5 - 8.1 g/dL -  Total Bilirubin 0.3 - 1.2 mg/dL -  Alkaline Phos 38 - 126 U/L -  AST 15 - 41 U/L -  ALT 0 - 44 U/L -   CBC Latest Ref Rng & Units 12/17/2017  WBC 3.8 - 10.6 K/uL 10.2  Hemoglobin 13.0 - 18.0 g/dL 6.6(L)  Hematocrit 40.0 - 52.0 % 20.2(L)  Platelets 150 - 440 K/uL 20(LL)   RADIOGRAPHIC STUDIES: I have personally reviewed the radiological images as listed and agreed with the findings in the report. Dg Chest Port 1 View  Result Date: 11/26/2017 CLINICAL DATA:  72 y/o M; hypotension and shortness of breath today. EXAM: PORTABLE CHEST 1 VIEW COMPARISON:  06/09/2017 CT chest. FINDINGS: Normal cardiac silhouette. Aortic atherosclerosis with calcification. Post median sternotomy with multiple broken wires. Reticular and peripheral linear opacities. No consolidation. Emphysema with upper lobe predominance. No pleural effusion or pneumothorax. No acute osseous abnormality is evident. IMPRESSION: Reticular opacities, likely interstitial edema, possibly atypical pneumonia. Pulmonary nodules are better assessed with CT of the chest when clinically indicated. Electronically Signed   By: Kristine Garbe M.D.   On: 12/03/2017 15:34    Assessment and plan-   # Anemia, hemoglobin 6.6,  Agree with transfuse 1 unit of PRBC to keep hemoglobin above 7.  Microcytosis. Check iron panel.  # Thrombocytopenia, baseline in July was 146,000 . I spoke with hematology lab, smear showed no schistocytes,  Check  differential, fibrinogen, haptoglobin, LDH, PT, PTT, [ordered this morning at 9AM, some of these labs are not done].  # metastatic bladder cancer, involving lung. per history provided by family, recent disease progression involving CNS, and possible liver.  Requests have been sent to [facility] to  obtain recent oncology records.  Prognosis is poor. Agree with palliative consult.    # Intermittent confusion, given that low platelet counts,  recommends CT brain to rule out intracranial bleeding. CT head was independently reviewed, did not show any visible intracranial mets. Hopefully able to obtain [facility] records to clarify.  # HCAP, continue IV antibiotics.    Thank you for allowing me to participate in the care of this patient.  Total face to face encounter time for this patient visit was 70 min. >50% of the time was  spent in counseling and coordination of care.    Earlie Server, MD, PhD Hematology Oncology [facility] at [facility] Pager- [phone] 12/17/2017

## 2017-12-17 NOTE — Progress Notes (Signed)
PT Cancellation Note  Patient Details Name: Gabriel Robbins MRN: [id number] DOB: 06-28-1945   Cancelled Treatment:    Reason Eval/Treat Not Completed: Other (comment). Consult received and chart reviewed. Pt with Hgb outside of therapy protocol at 6.6 this date. Pt with noted downtrending from 8.3->7.5->6.6. Will hold at this time and re-attempt when medically stable.   Merit [name] 12/17/2017, 8:25 AM Greggory Stallion, PT, DPT [phone]

## 2017-12-17 NOTE — Clinical Social Work Note (Signed)
CSW received a consult for SNF placement. PT is pending. CSW will follow pending PT evaluation.  Santiago Bumpers, MSW, Latanya Presser [phone]

## 2017-12-17 NOTE — Progress Notes (Signed)
[place] [facility] [place] NAME: Gabriel Robbins    MR#:  [id number]  DATE OF BIRTH:  21-Sep-1945  SUBJECTIVE:  CHIEF COMPLAINT:   Chief Complaint  Patient presents with  . Chest Pain  . Hypotension   Patient has had confusion.  Complains of low back pain which is chronic and has been worsening. Afebrile today.  Son at bedside  REVIEW OF SYSTEMS:    Review of Systems  Unable to perform ROS: Mental status change    DRUG ALLERGIES:   Allergies  Allergen Reactions  . Penicillin G Other (See Comments)    VITALS:  Blood pressure (!) 113/49, pulse 98, temperature 97.7 F (36.5 C), temperature source Oral, resp. rate 18, height 5\' 11"  (1.803 m), weight 91.4 kg, SpO2 93 %.  PHYSICAL EXAMINATION:   Physical Exam  GENERAL:  72 y.o.-year-old patient lying in the bed with no acute distress.  EYES: Pupils equal, round, reactive to light and accommodation. No scleral icterus. Extraocular muscles intact.  HEENT: Head atraumatic, normocephalic. Oropharynx and nasopharynx clear.  NECK:  Supple, no jugular venous distention. No thyroid enlargement, no tenderness.  LUNGS: Normal breath sounds bilaterally, no wheezing, rales, rhonchi. No use of accessory muscles of respiration.  CARDIOVASCULAR: S1, S2 normal. No murmurs, rubs, or gallops.  ABDOMEN: Soft, nontender, nondistended. Bowel sounds present. No organomegaly or mass.  EXTREMITIES: No cyanosis, clubbing or edema b/l.    NEUROLOGIC: Cranial nerves II through XII are intact. No focal Motor or sensory deficits b/l.   PSYCHIATRIC: The patient is alert and awake SKIN: No obvious rash, lesion, or ulcer.   LABORATORY PANEL:   CBC Recent Labs  Lab 12/17/17 0355  WBC 10.2  HGB 6.6*  HCT 20.2*  PLT 20*   ------------------------------------------------------------------------------------------------------------------ Chemistries  Recent Labs  Lab 12/03/2017 1542  12/17/17 0355  NA 139  --   143  K 3.4*  --  4.0  CL 99  --  110  CO2 25  --  25  GLUCOSE 119*  --  134*  BUN 37*  --  32*  CREATININE 1.66*   < > 1.50*  CALCIUM 8.5*  --  7.6*  AST 22  --   --   ALT 14  --   --   ALKPHOS 274*  --   --   BILITOT 1.1  --   --    < > = values in this interval not displayed.   ------------------------------------------------------------------------------------------------------------------  Cardiac Enzymes Recent Labs  Lab 12/17/17 0355  TROPONINI 0.08*   ------------------------------------------------------------------------------------------------------------------  RADIOLOGY:  Dg Chest Port 1 View  Result Date: 12/10/2017 CLINICAL DATA:  72 y/o M; hypotension and shortness of breath today. EXAM: PORTABLE CHEST 1 VIEW COMPARISON:  06/09/2017 CT chest. FINDINGS: Normal cardiac silhouette. Aortic atherosclerosis with calcification. Post median sternotomy with multiple broken wires. Reticular and peripheral linear opacities. No consolidation. Emphysema with upper lobe predominance. No pleural effusion or pneumothorax. No acute osseous abnormality is evident. IMPRESSION: Reticular opacities, likely interstitial edema, possibly atypical pneumonia. Pulmonary nodules are better assessed with CT of the chest when clinically indicated. Electronically Signed   By: Kristine Garbe M.D.   On: 12/10/2017 15:34     ASSESSMENT AND PLAN:   *Acute anemia and thrombocytopenia.  Discussed with Dr. Tasia Catchings of oncology.  Smears to be reviewed to rule out schistocytes.  Critically ill.  Will transfuse 1 unit packed RBC.  No acute bleeding found.  Will need platelet transfusion if  further worsening. Check DIC panel  *Bilateral pneumonia with Klebsiella bacteremia and sepsis present on admission We will change antibiotics to IV ceftriaxone.  Final sensitivities pending.  Oxygen as needed.  Nebulizers as needed  *Bladder cancer with wide metastasis Worsening in spite of aggressive treatment  at the [place] according to son.  Would benefit from hospice at discharge.  Has follow-up with radiation oncology in 2 weeks due to newfound metastasis in the brain.  *Acute encephalopathy.  Progressive over the past few weeks.  Likely due to his brain mets.  Will check CT scan of the head to rule out intracranial hemorrhage.  *Chronic low back pain.  Pain medications added.  *Atrial fibrillation on amiodarone  Had lengthy discussion with son at bedside and later outside the patient's room.  Patient confused and unable to make medical decisions.  He has had worsening bladder cancer with wide metastasis.  Follows at [place].  Recently was found to have mets in the brain on MRI.  Patient is not aware of this finding according to son.  Has follow-up with radiation oncology in 2 weeks.  Family considering hospice at home at this time.  Would like aggressive treatment continued with IV antibiotics at this time. Son understands high risk for deterioration, complications and death.  DO NOT RESUSCITATE and DO NOT INTUBATE.  All the records are reviewed and case discussed with Care Management/Social Worker Management plans discussed with the patient, family and they are in agreement.  CODE STATUS: DNR/DNI  DVT Prophylaxis: SCDs  TOTAL Critical care TIME TAKING CARE OF THIS PATIENT: 80 minutes.   POSSIBLE D/C IN 3-4 DAYS, DEPENDING ON CLINICAL CONDITION.  [name] M.D on 12/17/2017 at 1:22 PM  Between 7am to 6pm - Pager - [phone]  After 6pm go to [email or web address] - password [email or web address] [facility]  Office  [phone]  CC: Primary care physician; [name], NP  Note: This dictation was prepared with Dragon dictation along with smaller phrase technology. Any transcriptional errors that result from this process are unintentional.

## 2017-12-17 NOTE — Progress Notes (Signed)
PHARMACY - PHYSICIAN COMMUNICATION CRITICAL VALUE ALERT - BLOOD CULTURE IDENTIFICATION (BCID)  Gabriel Robbins is an 72 y.o. male who presented to [facility] on 11/30/2017 with a chief complaint of   Assessment:  2/4 K. pneumo KPC(-) (include suspected source if known)  Name of physician (or Provider) Contacted: Marcille Blanco  Current antibiotics: vanc,/cefepime  Changes to prescribed antibiotics recommended:  n/a  Results for orders placed or performed during the hospital encounter of 11/25/2017  Blood Culture ID Panel (Reflexed) (Collected: 12/11/2017  3:42 PM)  Result Value Ref Range   Enterococcus species NOT DETECTED NOT DETECTED   Listeria monocytogenes NOT DETECTED NOT DETECTED   Staphylococcus species NOT DETECTED NOT DETECTED   Staphylococcus aureus NOT DETECTED NOT DETECTED   Streptococcus species NOT DETECTED NOT DETECTED   Streptococcus agalactiae NOT DETECTED NOT DETECTED   Streptococcus pneumoniae NOT DETECTED NOT DETECTED   Streptococcus pyogenes NOT DETECTED NOT DETECTED   Acinetobacter baumannii NOT DETECTED NOT DETECTED   Enterobacteriaceae species DETECTED (A) NOT DETECTED   Enterobacter cloacae complex NOT DETECTED NOT DETECTED   Escherichia coli NOT DETECTED NOT DETECTED   Klebsiella oxytoca NOT DETECTED NOT DETECTED   Klebsiella pneumoniae DETECTED (A) NOT DETECTED   Proteus species NOT DETECTED NOT DETECTED   Serratia marcescens NOT DETECTED NOT DETECTED   Carbapenem resistance NOT DETECTED NOT DETECTED   Haemophilus influenzae NOT DETECTED NOT DETECTED   Neisseria meningitidis NOT DETECTED NOT DETECTED   Pseudomonas aeruginosa NOT DETECTED NOT DETECTED   Candida albicans NOT DETECTED NOT DETECTED   Candida glabrata NOT DETECTED NOT DETECTED   Candida krusei NOT DETECTED NOT DETECTED   Candida parapsilosis NOT DETECTED NOT DETECTED   Candida tropicalis NOT DETECTED NOT DETECTED    Gabriel Robbins 12/17/2017  3:52 AM

## 2017-12-18 LAB — CBC WITH DIFFERENTIAL/PLATELET
BASOS PCT: 0 %
Band Neutrophils: 0 %
Basophils Absolute: 0 10*3/uL (ref 0–0.1)
Blasts: 0 %
Eosinophils Absolute: 0 10*3/uL (ref 0–0.7)
Eosinophils Relative: 0 %
HEMATOCRIT: 25.1 % — AB (ref 40.0–52.0)
HEMOGLOBIN: 8 g/dL — AB (ref 13.0–18.0)
Lymphocytes Relative: 1 %
Lymphs Abs: 0.1 10*3/uL — ABNORMAL LOW (ref 1.0–3.6)
MCH: 24.9 pg — AB (ref 26.0–34.0)
MCHC: 31.7 g/dL — ABNORMAL LOW (ref 32.0–36.0)
MCV: 78.6 fL — AB (ref 80.0–100.0)
MYELOCYTES: 1 %
Metamyelocytes Relative: 0 %
Monocytes Absolute: 0.3 10*3/uL (ref 0.2–1.0)
Monocytes Relative: 2 %
NEUTROS PCT: 96 %
NRBC: 3 /100{WBCs} — AB
Neutro Abs: 12.6 10*3/uL — ABNORMAL HIGH (ref 1.4–6.5)
Other: 0 %
PROMYELOCYTES RELATIVE: 0 %
RBC: 3.2 MIL/uL — AB (ref 4.40–5.90)
RDW: 19.1 % — ABNORMAL HIGH (ref 11.5–14.5)
WBC: 13 10*3/uL — AB (ref 3.8–10.6)

## 2017-12-18 LAB — PROTIME-INR
INR: 1.46
Prothrombin Time: 17.6 seconds — ABNORMAL HIGH (ref 11.4–15.2)

## 2017-12-18 LAB — VITAMIN B12: VITAMIN B 12: 328 pg/mL (ref 180–914)

## 2017-12-18 LAB — IRON AND TIBC
IRON: 108 ug/dL (ref 45–182)
SATURATION RATIOS: 49 % — AB (ref 17.9–39.5)
TIBC: 219 ug/dL — AB (ref 250–450)
UIBC: 111 ug/dL

## 2017-12-18 LAB — FIBRINOGEN: Fibrinogen: 316 mg/dL (ref 210–475)

## 2017-12-18 LAB — FERRITIN: Ferritin: 3395 ng/mL — ABNORMAL HIGH (ref 24–336)

## 2017-12-18 LAB — FIBRIN DERIVATIVES D-DIMER (ARMC ONLY): Fibrin derivatives D-dimer (ARMC): >7500 ng/mL (FEU) — ABNORMAL HIGH (ref 0.00–499.00)

## 2017-12-18 LAB — APTT: APTT: 39 s — AB (ref 24–36)

## 2017-12-18 MED ORDER — LORAZEPAM 2 MG/ML PO CONC
2.0000 mg | ORAL | Status: DC | PRN
  Administered 2017-12-19 (×3): 2 mg via ORAL
  Filled 2017-12-18 (×3): qty 1

## 2017-12-18 MED ORDER — ORAL CARE MOUTH RINSE
15.0000 mL | Freq: Two times a day (BID) | OROMUCOSAL | Status: DC
  Administered 2017-12-18: 15 mL via OROMUCOSAL

## 2017-12-18 MED ORDER — IPRATROPIUM-ALBUTEROL 0.5-2.5 (3) MG/3ML IN SOLN
3.0000 mL | Freq: Three times a day (TID) | RESPIRATORY_TRACT | Status: DC
  Administered 2017-12-18 – 2017-12-19 (×4): 3 mL via RESPIRATORY_TRACT
  Filled 2017-12-18 (×4): qty 3

## 2017-12-18 MED ORDER — CEFDINIR 125 MG/5ML PO SUSR
300.0000 mg | Freq: Two times a day (BID) | ORAL | Status: DC
  Administered 2017-12-18: 300 mg via ORAL
  Filled 2017-12-18: qty 15
  Filled 2017-12-18: qty 6
  Filled 2017-12-18: qty 15
  Filled 2017-12-18: qty 6
  Filled 2017-12-18: qty 15

## 2017-12-18 MED ORDER — LORAZEPAM 2 MG/ML PO CONC
2.0000 mg | Freq: Four times a day (QID) | ORAL | Status: DC | PRN
  Administered 2017-12-18: 15:00:00 2 mg via ORAL
  Filled 2017-12-18: qty 1

## 2017-12-18 MED ORDER — ALPRAZOLAM 1 MG PO TABS: 2.0000 mg | ORAL_TABLET | Freq: Once | ORAL | Status: DC | PRN

## 2017-12-18 MED ORDER — QUETIAPINE FUMARATE 25 MG PO TABS
25.0000 mg | ORAL_TABLET | Freq: Two times a day (BID) | ORAL | Status: DC
  Administered 2017-12-18 (×2): 25 mg via ORAL
  Filled 2017-12-18 (×2): qty 1

## 2017-12-18 MED ORDER — ALPRAZOLAM 1 MG PO TABS
1.0000 mg | ORAL_TABLET | Freq: Once | ORAL | Status: AC
  Administered 2017-12-18: 08:00:00 1 mg via ORAL
  Filled 2017-12-18: qty 1

## 2017-12-18 MED ORDER — CHLORHEXIDINE GLUCONATE 0.12 % MT SOLN: 15.0000 mL | Freq: Two times a day (BID) | OROMUCOSAL | Status: DC

## 2017-12-18 MED ORDER — MORPHINE SULFATE (CONCENTRATE) 10 MG/0.5ML PO SOLN
10.0000 mg | ORAL | Status: DC | PRN
  Administered 2017-12-18 – 2017-12-19 (×5): 10 mg via ORAL
  Filled 2017-12-18 (×5): qty 1

## 2017-12-18 MED ORDER — ALPRAZOLAM 1 MG PO TABS: 2.0000 mg | ORAL_TABLET | Freq: Once | ORAL | Status: DC

## 2017-12-18 NOTE — Progress Notes (Signed)
Patient had oxygen off and was attempting to get out of bed with RN and Assistants help prior to neb, placed patient back on 4l after neb.

## 2017-12-18 NOTE — Progress Notes (Addendum)
Advance care planning  Patient is confused and drowsy.  Son Gerald Stabs is the healthcare power of attorney.  Also present in the room is his partner.  Discussed regarding patient's worsening bladder cancer in spite of treatment and my discussion with Dr. Tasia Catchings.  He also has thrombocytopenia and anemia.  At this time patient has been agitated due to his low back pain and confusion.  Pulled IV line out.  We discussed regarding prioritizing comfort at this time and increasing doses on pain medications and anxiety medications if needed even if patient gets drowsy.  Would leave IV line out to prevent any pain.  Change IV antibiotic to oral for another 24 hours.   Patient not eating anything.  Also significantly decreased nutrition in the last 1 week.  Patient continues to have poor nutrition and continues to deteriorate his life expectancy is less than 2 weeks.   Transition to comfort measures if no improvement. Consult palliative care.  Add liquid Ativan and morphine orally PRN  Time spent 20 minutes.

## 2017-12-18 NOTE — Progress Notes (Signed)
Pt has not slept for most of the night. Pt with some confusion and disorientation.  Pt attempting to get out of bed, taking off oxygen frequently, complains he can't breath but oxygen level reaches 95-100% with deep, slow breathing. Pt given ativan for anxiety but does not seem to have any effect. Pt feels he has phlegm that he can't clear from his throat and asking for additional medication. Dorna Bloom RN

## 2017-12-18 NOTE — Progress Notes (Signed)
PT Cancellation Note  Patient Details Name: Gabriel Robbins MRN: [id number] DOB: 1945-08-20   Cancelled Treatment:    Reason Eval/Treat Not Completed: Other (comment). Spoke with RN following patient's chart review. Per RN, PT is not needed at this time; palliative care has been consulted. PT evaluation was held and patient's PT order was completed.   [name], PT, DPT, CWCE  [name] [name] 12/18/2017, 2:18 PM

## 2017-12-18 NOTE — Progress Notes (Signed)
[facility] NAME: Gabriel Robbins    MR#:  [id number]  DATE OF BIRTH:  May 18, 1945  SUBJECTIVE:  CHIEF COMPLAINT:   Chief Complaint  Patient presents with  . Chest Pain  . Hypotension   Agitated and not eating. Son's partner tells me he has not eaten anything in close to a week.  Has had poor nutrition even prior to that.  More and more confusion.  Afebrile.  Agitated and needed Ativan and Xanax earlier.  REVIEW OF SYSTEMS:    Review of Systems  Unable to perform ROS: Mental status change    DRUG ALLERGIES:   Allergies  Allergen Reactions  . Penicillin G Other (See Comments)    VITALS:  Blood pressure 126/63, pulse 89, temperature 97.8 F (36.6 C), temperature source Oral, resp. rate 18, height 5\' 11"  (1.803 m), weight 91.4 kg, SpO2 (!) 87 %.  PHYSICAL EXAMINATION:   Physical Exam  GENERAL:  72 y.o.-year-old patient lying in the bed. EYES: Pupils equal, round, reactive to light and accommodation. No scleral icterus. Extraocular muscles intact.  HEENT: Head atraumatic, normocephalic. Oropharynx and nasopharynx clear.  NECK:  Supple, no jugular venous distention. No thyroid enlargement, no tenderness.  LUNGS: Normal breath sounds bilaterally, no wheezing, rales, rhonchi. No use of accessory muscles of respiration.  CARDIOVASCULAR: S1, S2 normal. No murmurs, rubs, or gallops.  ABDOMEN: Soft, nontender, nondistended. Bowel sounds present. No organomegaly or mass.  Colostomy in place EXTREMITIES: No cyanosis, clubbing or edema b/l.    NEUROLOGIC: Moves all 4 extremities PSYCHIATRIC: Drowsy SKIN: Bruising  LABORATORY PANEL:   CBC Recent Labs  Lab 12/18/17 0422  WBC 13.0*  HGB 8.0*  HCT 25.1*  PLT COUNT MAY BE INACCURATE DUE TO FIBRIN CLUMPS.   ------------------------------------------------------------------------------------------------------------------ Chemistries  Recent Labs  Lab 12/17/17 0355  12/17/17 2000  NA 143  --   K 4.0  --   CL 110  --   CO2 25  --   GLUCOSE 134*  --   BUN 32*  --   CREATININE 1.50*  --   CALCIUM 7.6*  --   AST  --  24  ALT  --  12  ALKPHOS  --  240*  BILITOT  --  0.5   ------------------------------------------------------------------------------------------------------------------  Cardiac Enzymes Recent Labs  Lab 12/17/17 0355  TROPONINI 0.08*   ------------------------------------------------------------------------------------------------------------------  RADIOLOGY:  Ct Head Wo Contrast  Result Date: 12/17/2017 CLINICAL DATA:  Altered mental status. Acute encephalopathy with progression over the last few weeks. EXAM: CT HEAD WITHOUT CONTRAST TECHNIQUE: Contiguous axial images were obtained from the base of the skull through the vertex without intravenous contrast. COMPARISON:  None. FINDINGS: Brain: Imaging of the brain is nonspecific. Remote white matter infarct is present anteriorly in the left frontal lobe. Basal ganglia are intact. Remote lacunar infarcts are noted in the left cerebellum. Mild generalized atrophy and white matter disease is present bilaterally. No acute infarct, hemorrhage, or mass lesion is present. Ventricles are proportionate to the degree of atrophy. No significant extra-axial fluid collection is present. Vascular: Atherosclerotic calcifications are present within the cavernous internal carotid arteries and at the dural margin of both vertebral arteries. Skull: Calvarium is intact. No focal lytic or blastic lesions are present. Sinuses/Orbits: A polyp or mucous retention cyst is present inferiorly in the left maxillary sinus. There is wall thickening suggests a history of chronic disease in the left maxillary sinus. No acute fluid levels are present. The paranasal sinuses  and the mastoid air cells are otherwise clear. Globes and orbits are within normal limits. IMPRESSION: 1. Nonspecific appearance of mild generalized  atrophy and white matter disease. This likely reflects the sequela of chronic microvascular ischemia. 2. 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 3. Remote lacunar infarcts in the left cerebellum. Electronically Signed   By: San Morelle M.D.   On: 12/17/2017 14:32   Dg Chest Port 1 View  Result Date: 12/22/2017 CLINICAL DATA:  72 y/o M; hypotension and shortness of breath today. EXAM: PORTABLE CHEST 1 VIEW COMPARISON:  06/09/2017 CT chest. FINDINGS: Normal cardiac silhouette. Aortic atherosclerosis with calcification. Post median sternotomy with multiple broken wires. Reticular and peripheral linear opacities. No consolidation. Emphysema with upper lobe predominance. No pleural effusion or pneumothorax. No acute osseous abnormality is evident. IMPRESSION: Reticular opacities, likely interstitial edema, possibly atypical pneumonia. Pulmonary nodules are better assessed with CT of the chest when clinically indicated. Electronically Signed   By: Kristine Garbe M.D.   On: 11/26/2017 15:34     ASSESSMENT AND PLAN:   *Acute anemia and thrombocytopenia.  Reviewed labs and discussed with oncology.  Not DIC.  Could be due to malignancy.  Or VTE. No further testing after discussing with family regarding possible transition to comfort measures if no improvement by tomorrow.  *Bilateral pneumonia with Klebsiella bacteremia and sepsis present on admission Patient is pulling his IV line out.  Discussed with family.  No further IV lines.  Will change to oral antibiotics  *Bladder cancer with wide metastasis Worsening in spite of aggressive treatment at the [place] according to son.   Discussed with Dr. Tasia Catchings of oncology.  Requested records from [place].  *Acute encephalopathy.  Progressive over the past few weeks.  Likely due to his brain mets and acute illness. CT scan of the head checked and showed no acute hemorrhage  *Chronic low back pain.  Pain medications added.  *Atrial  fibrillation on amiodarone  Discussed at length with family.  At this time considering comfort but continue oral antibiotics.  If no improvement in the next 24 hours will transition to comfort care.  Consult palliative care.  DO NOT RESUSCITATE and DO NOT INTUBATE.  All the records are reviewed and case discussed with Care Management/Social Worker Management plans discussed with the patient, family and they are in agreement.  CODE STATUS: DNR/DNI  DVT Prophylaxis: SCDs  TOTAL TIME TAKING CARE OF THIS PATIENT: 30 minutes.   Leia Alf Daxson Reffett M.D on 12/18/2017 at 10:45 AM  Between 7am to 6pm - Pager - [phone]  After 6pm go to [email or web address] - password [email or web address] [facility]  Office  [phone]  CC: Primary care physician; [name], NP  Note: This dictation was prepared with Dragon dictation along with smaller phrase technology. Any transcriptional errors that result from this process are unintentional.

## 2017-12-19 DIAGNOSIS — Z7189 Other specified counseling: Secondary | ICD-10-CM

## 2017-12-19 DIAGNOSIS — J189 Pneumonia, unspecified organism: Secondary | ICD-10-CM

## 2017-12-19 DIAGNOSIS — R7881 Bacteremia: Secondary | ICD-10-CM

## 2017-12-19 DIAGNOSIS — G893 Neoplasm related pain (acute) (chronic): Secondary | ICD-10-CM

## 2017-12-19 DIAGNOSIS — D696 Thrombocytopenia, unspecified: Secondary | ICD-10-CM

## 2017-12-19 DIAGNOSIS — F411 Generalized anxiety disorder: Secondary | ICD-10-CM

## 2017-12-19 DIAGNOSIS — Z87891 Personal history of nicotine dependence: Secondary | ICD-10-CM

## 2017-12-19 DIAGNOSIS — C799 Secondary malignant neoplasm of unspecified site: Secondary | ICD-10-CM

## 2017-12-19 DIAGNOSIS — B961 Klebsiella pneumoniae [K. pneumoniae] as the cause of diseases classified elsewhere: Secondary | ICD-10-CM

## 2017-12-19 DIAGNOSIS — R0600 Dyspnea, unspecified: Secondary | ICD-10-CM

## 2017-12-19 DIAGNOSIS — C679 Malignant neoplasm of bladder, unspecified: Secondary | ICD-10-CM

## 2017-12-19 DIAGNOSIS — N179 Acute kidney failure, unspecified: Secondary | ICD-10-CM

## 2017-12-19 DIAGNOSIS — A419 Sepsis, unspecified organism: Secondary | ICD-10-CM

## 2017-12-19 DIAGNOSIS — Z515 Encounter for palliative care: Secondary | ICD-10-CM

## 2017-12-19 DIAGNOSIS — Z8551 Personal history of malignant neoplasm of bladder: Secondary | ICD-10-CM

## 2017-12-19 LAB — CULTURE, BLOOD (ROUTINE X 2): Special Requests: ADEQUATE

## 2017-12-19 LAB — TYPE AND SCREEN
ABO/RH(D): O POS
Antibody Screen: NEGATIVE
Unit division: 0

## 2017-12-19 LAB — BPAM RBC
BLOOD PRODUCT EXPIRATION DATE: 201909012359
ISSUE DATE / TIME: 201908242222
UNIT TYPE AND RH: 9500

## 2017-12-19 LAB — PATHOLOGIST SMEAR REVIEW

## 2017-12-19 MED ORDER — LORAZEPAM 2 MG/ML PO CONC
4.0000 mg | ORAL | Status: DC | PRN
  Administered 2017-12-19 (×3): 4 mg via ORAL
  Filled 2017-12-19 (×3): qty 2

## 2017-12-19 MED ORDER — MORPHINE SULFATE (CONCENTRATE) 10 MG/0.5ML PO SOLN
20.0000 mg | ORAL | Status: DC | PRN
  Administered 2017-12-19 (×3): 20 mg via ORAL
  Filled 2017-12-19 (×3): qty 1

## 2017-12-19 MED ORDER — GLYCOPYRROLATE 0.2 MG/ML IJ SOLN
0.2000 mg | INTRAMUSCULAR | Status: DC | PRN
  Filled 2017-12-19: qty 1

## 2017-12-19 MED ORDER — MORPHINE SULFATE (CONCENTRATE) 10 MG/0.5ML PO SOLN
10.0000 mg | ORAL | Status: DC | PRN
  Administered 2017-12-19: 12:00:00 10 mg via ORAL
  Filled 2017-12-19: qty 1

## 2017-12-19 NOTE — Progress Notes (Addendum)
1930 12/18/17  Pt family in room and asking about palliative care and pt prognosis.  Discussed the difference between aggressive care (need to establish a new IV line, most likely would need IJ or CL and might have to be restrained to prevent pt from pulling out, frequent blood draws, possible bipap to treat hypoxia) and comfort care. Reinforced the need to speak with doctors about what relief radiation and other treatments would provide versus the side effects of treatment, pt's prognosis if given aggressive care and goals of care - dtr in law states that pt likes to go out with family to baseball games and she wants to know if pt would return to that level of functioning with treatment.  Encouraged family to speak with oncology since pt has brain mets and that this might be a progression of that with continued decline. Dorna Bloom RN

## 2017-12-19 NOTE — Consult Note (Signed)
Consultation Note Date: 12/19/2017   Patient Name: Gabriel Robbins  DOB: Aug 07, 1945  MRN: [id number]  Age / Sex: 72 y.o., male  PCP: [name], NP Referring Physician: Hillary Bow, MD  Reason for Consultation: Establishing goals of care  HPI/Patient Profile: 72 y.o. male  with past medical history of metastatic bladder cancer s/p cystectomy with external urostomy, MI, aortic valve replacement, CAD, depression, afib, AAA admitted on 12/01/2017 with shortness of breath and chest pain. In ED, found to have bilateral pneumonia and klebsiella bacteremia. Acute hypoxic respiratory failure secondary to pneumonia. Acute encephalopathy likely due to brain mets and acute illness. Palliative medicine consultation for goals of care.   Clinical Assessment and Goals of Care:  I have reviewed medical records, discussed with Dr. Darvin Neighbours, and met with patient's son Gerald Stabs) and daughter-in-law Claiborne Billings) in family waiting room to discuss diagnosis, prognosis, GOC, EOL wishes, disposition and options.  Introduced Palliative Medicine as specialized medical care for people living with serious illness. It focuses on providing relief from the symptoms and stress of a serious illness.   We discussed a brief life review of the patient. Divorced. Gerald Stabs is his only children. Five grandchildren. Up until this admission, patient living home alone. Mr. Bachtel was in the [employer]. 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 His greatest joy is spending time with his family including his grandchildren.   Recalled his battle with metastatic cancer, diagnosed about one year ago per son. He has been followed by [place]. He was adamant to receive surgical intervention after chemotherapy was completed (his physician was surprised with the fact he tolerated chemo and was able to proceed with surgery). Gerald Stabs and Claiborne Billings speak of the  patient not sharing much information with them throughout the course of his diagnosis. They believe he has had difficulty accepting he did not achieve remission after the bladder was removed. They share recent finding of mets to liver, lung, and now brain. He never wished to discuss his prognosis or EOL wishes with them.   Discussed events leading up to hospitalization and course of hospital diagnoses and interventions. Gerald Stabs and [place] share their conversation with Dr. Darvin Neighbours yesterday and with the fact they are starting to accept poor prognosis. They still have small hope that with continuing antibiotics, he may show improvement. They are upset with their selves for giving him "tough love" and forcing him to eat and drink. Claiborne Billings shares that this morning he told them he wanted to be "left alone." They do acknowledge that he appears to be in pain this morning and with shortness of breath (RN was instructed to give prn Roxanol).    I attempted to elicit values and goals of care important to the patient. Advanced directives, concepts specific to code status, artifical feeding and hydration, and rehospitalization were considered and discussed. Again, Ronalee Belts did not discuss his wishes with Gerald Stabs. Gerald Stabs does share the conversation that was had with his father and ER doctor, regarding the fact his father did  not wish for heroic measures including resuscitation/life support. I agreed with limitations against heroic measures with underlying terminal cancer diagnosis.   The difference between aggressive medical intervention and comfort care was considered in light of the patient's goals of care. Gerald Stabs struggles with the thought of losing his father but shares that he does not wish to see him suffer. Gerald Stabs speaks of talking with family members over the weekend, who recommended hospice services.   Introduced hospice philosophy and options. Educated on transition to comfort measures in order to allow comfort, peace, and  dignity at EOL. Educated on EOL expectations and many of the symptoms that we are seeing that indicates he is nearing the EOL. He is only taking bites and sips. His cognitive status and declined over the weekend despite aggressive antibiotics and IVF.   Gerald Stabs is tearful but ready to begin transition to comfort with transfer to hospice facility. Educated on role of symptom management to relieve pain and suffering.   Gerald Stabs and [place] share that he was at [facility] with their family two weeks ago. They share that he was able to cross the bridge on his electric scooter and had a spiritual encounter on the bridge. They feel that was the moment he started to accept his terminal illness and EOL. They have seen a great decline since that day. Therapeutic listening provided.   Questions and concerns were addressed.  Hard Choices booklet left for review. Emotional/spiritual support provided.    SUMMARY OF RECOMMENDATIONS    DNR/DNI  Transition to comfort measures only.   Symptom management--see below  Will continue antibiotics until transfer to hospice facility. Family understands antibiotics will not be continued once transferred.   SW consulted for residential hospice.   Code Status/Advance Care Planning:  DNR  Symptom Management:   Roxanol '10mg'$  SL q2h prn pain/dyspnea/air hunger  Ativan '2mg'$  PO q2h prn anxiety/agitation  Continue Seroquel BID  Palliative Prophylaxis:   Aspiration, Delirium Protocol, Frequent Pain Assessment, Oral Care and Turn Reposition  Additional Recommendations (Limitations, Scope, Preferences):  Full Comfort Care  Psycho-social/Spiritual:   Desire for further Chaplaincy support:yes  Additional Recommendations: Caregiving  Support/Resources and Education on Hospice  Prognosis:   < 2 weeks if not days with metastatic bladder cancer, pneumonia, klebsiella bacteremia, and declining functional/cognitive/nutritional status.   Discharge Planning:  Hospice facility      Primary Diagnoses: Present on Admission: . HCAP (healthcare-associated pneumonia) . Bacteremia   I have reviewed the medical record, interviewed the patient and family, and examined the patient. The following aspects are pertinent.  Past Medical History:  Diagnosis Date  . AAA (abdominal aortic aneurysm) without rupture ([place]) 03/2017   AAA status post endovascular repair Dec 2018  . Allergy   . Atrial fibrillation ([place])    on amiodarone  . Bladder cancer [facility])    s/p cystectomy with external urostomy bag  . Coronary artery disease   . Depression   . H/O aortic valve replacement 2011  . Myocardial infarction ([place])   . Prostate cancer [facility])    Social History   Socioeconomic History  . Marital status: Divorced    Spouse name: Not on file  . Number of children: Not on file  . Years of education: Not on file  . Highest education level: Not on file  Occupational History  . Not on file  Social Needs  . Financial resource strain: Not hard at all  . Food insecurity:    Worry: Never true  Inability: Never true  . Transportation needs:    Medical: No    Non-medical: No  Tobacco Use  . Smoking status: Former Smoker    Packs/day: 0.50    Years: 50.00    Pack years: 25.00  . Smokeless tobacco: Never Used  Substance and Sexual Activity  . Alcohol use: Yes    Comment: very rare  . Drug use: No  . Sexual activity: Not Currently  Lifestyle  . Physical activity:    Days per week: 0 days    Minutes per session: 0 min  . Stress: Not at all  Relationships  . Social connections:    Talks on phone: Once a week    Gets together: Once a week    Attends religious service: Never    Active member of club or organization: No    Attends meetings of clubs or organizations: Never    Relationship status: Divorced  Other Topics Concern  . Not on file  Social History Narrative   Lives alone, home in past   Family History  Problem Relation Age of Onset    . Thyroid disease Mother   . Heart attack Father   . Heart disease Brother   . Parkinson's disease Brother    Scheduled Meds: . cefdinir  300 mg Oral BID  . chlorhexidine  15 mL Mouth Rinse BID  . mouth rinse  15 mL Mouth Rinse q12n4p  . nystatin  5 mL Mouth/Throat QID  . QUEtiapine  25 mg Oral BID   Continuous Infusions: PRN Meds:.acetaminophen **OR** acetaminophen, albuterol, ALPRAZolam, diclofenac sodium, guaiFENesin, LORazepam, morphine CONCENTRATE, prochlorperazine, senna-docusate Medications Prior to Admission:  Prior to Admission medications   Medication Sig Start Date End Date Taking? Authorizing Provider  acidophilus (RISAQUAD) CAPS capsule Take 1 capsule by mouth daily.   Yes [provider]  albuterol (PROVENTIL HFA;VENTOLIN HFA) 108 (90 Base) MCG/ACT inhaler Inhale 1-2 puffs into the lungs every 6 (six) hours as needed for wheezing or shortness of breath.    Yes [provider]  albuterol (PROVENTIL) (2.5 MG/3ML) 0.083% nebulizer solution Take 2.5 mg by nebulization every 6 (six) hours as needed for wheezing or shortness of breath.   Yes [provider]  ammonium lactate (LAC-HYDRIN) 12 % lotion Apply 1 application topically daily as needed for dry skin.   Yes [provider]  carvedilol (COREG) 12.5 MG tablet Take 12.5 mg by mouth 2 (two) times daily with a meal.    Yes [provider]  diclofenac sodium (VOLTAREN) 1 % GEL Apply 4 g topically 4 (four) times daily as needed (to hands and hips).    Yes [provider]  guaiFENesin (ROBITUSSIN) 100 MG/5ML SOLN Take 5-10 mLs by mouth every 4 (four) hours as needed for cough.   Yes [provider]  Melatonin 3 MG TABS Take 6-9 mg by mouth at bedtime as needed (sleep).   Yes [provider]  nystatin (MYCOSTATIN) 100000 UNIT/ML suspension Take 5 mLs by mouth 3 (three) times daily as needed (recurrent thrush).   Yes [provider]  prochlorperazine  (COMPAZINE) 5 MG tablet Take 5 mg by mouth 2 (two) times daily as needed for nausea or vomiting.   Yes [provider]  sennosides-docusate sodium (SENOKOT-S) 8.6-50 MG tablet Take 2 tablets by mouth 2 (two) times daily as needed for constipation.   Yes [provider]  sertraline (ZOLOFT) 25 MG tablet Take 25-50 mg by mouth daily.    Yes [provider]  Tiotropium Bromide-Olodaterol (STIOLTO RESPIMAT) 2.5-2.5 MCG/ACT AERS Inhale 2 puffs into the lungs every morning.   Yes [provider]  traMADol (ULTRAM) 50 MG tablet Take 50-100 mg by mouth 2 (two) times daily as needed for moderate pain.   Yes [provider]  vitamin B-12 (CYANOCOBALAMIN) 1000 MCG tablet Take 1,000 mcg by mouth daily.   Yes [provider]  loperamide (IMODIUM A-D) 2 MG tablet Take 1 tablet (2 mg total) by mouth 4 (four) times daily as needed for diarrhea or loose stools. Patient not taking: Reported on 12/15/2017 09/12/17   Earleen Newport, MD  potassium chloride (K-DUR) 10 MEQ tablet Take 1 tablet (10 mEq total) by mouth daily. Patient not taking: Reported on 12/11/2017 09/12/17   Earleen Newport, MD   Allergies  Allergen Reactions  . Penicillin G Other (See Comments)   Review of Systems  Unable to perform ROS: Acuity of condition   Physical Exam  Constitutional: He is easily aroused. He appears ill.  HENT:  Head: Normocephalic and atraumatic.  Pulmonary/Chest: Accessory muscle usage present. He is in respiratory distress.  Dyspnea at rest. RN to give prn Roxanol  Abdominal: There is no tenderness.  Neurological: He is easily aroused. He is disoriented.  Drowsy, disoriented  Skin: Skin is warm and dry.  Psychiatric: His speech is delayed. Cognition and memory are impaired. He is inattentive.  Nursing note and vitals reviewed.   Vital Signs: BP 104/63 (BP Location: Right Arm)   Pulse 91   Temp (!) 97.5 F (36.4 C) (Axillary)   Resp (!) 22   Ht 5'  11" (1.803 m)   Wt 91.4 kg   SpO2 92%   BMI 28.12 kg/m  Pain Scale: PAINAD   Pain Score: Asleep   SpO2: SpO2: 92 % O2 Device:SpO2: 92 % O2 Flow Rate: .O2 Flow Rate (L/min): 5 L/min  IO: Intake/output summary:   Intake/Output Summary (Last 24 hours) at 12/19/2017 1549 Last data filed at 12/19/2017 1345 Gross per 24 hour  Intake 0 ml  Output 1000 ml  Net -1000 ml    LBM: Last BM Date: 11/25/2017 Baseline Weight: Weight: 89.8 kg Most recent weight: Weight: 91.4 kg     Palliative Assessment/Data: PPS 20%   Flowsheet Rows     Most Recent Value  Intake Tab  Referral Department  Hospitalist  Unit at Time of Referral  Oncology Unit  Palliative Care Primary Diagnosis  Cancer  Date Notified  12/18/17  Palliative Care Type  New Palliative care  Reason for referral  Clarify Goals of Care  Date of Admission  11/30/2017  Date first seen by Palliative Care  12/19/17  # of days Palliative referral response time  1 Day(s)  # of days IP prior to Palliative referral  2  Clinical Assessment  Palliative Performance Scale Score  20%  Psychosocial & Spiritual Assessment  Palliative Care Outcomes  Patient/Family meeting held?  Yes  Who was at the meeting?  son, daughter-in-law  Palliative Care Outcomes  Clarified goals of care, Counseled regarding hospice, Provided psychosocial or spiritual support, ACP counseling assistance, Improved pain interventions, Improved non-pain symptom therapy, Provided end of life care assistance, Changed to focus on comfort, Transitioned to hospice      Time In: 0930 Time Out: 1040 Time Total: 39mn Greater than 50%  of this time was spent counseling and coordinating care related to the above assessment and plan.  Signed by: MBasilio Cairo NP  Please contact Palliative Medicine Team phone at [phone] for questions and concerns.  For individual provider: See Shea Evans

## 2017-12-19 NOTE — Progress Notes (Signed)
   12/19/17 2232  Clinical Encounter Type  Visited With Patient and family together  Visit Type Follow-up;Spiritual support  Referral From Nurse  Consult/Referral To Chaplain   Paged for family of actively dying patient requesting prayer.  Son, ex-wife, and close family friend present in the room.  Chaplain prayed with family, provided pastoral presence, active listening, and emotional support.  Encouraged family to request on-call chaplain, even during overnight hours, if it would be helpful to them.

## 2017-12-19 NOTE — Progress Notes (Signed)
Pt with periods of restlessness.  Taking off gown, oxygen and trying to get out of bed.  Pt groaning loudly and saying he is not sick. Scheduled Seroquel given at 2159, 2322 oral morphine 10 mg given, 0005 oral ativan 2 mg given. Dorna Bloom RN

## 2017-12-19 NOTE — Progress Notes (Signed)
OT Cancellation Note  Patient Details Name: Gabriel Robbins MRN: [id number] DOB: 16-Mar-1946   Cancelled Treatment:    Reason Eval/Treat Not Completed: Medical issues which prohibited therapy.Order received and chart reviewed.  Pt with declining medical status with brain mets.  Pt having a Palliative care consult today and most likely will not be appropriate for OT after discussion with Candace from SW.  Please re-consult if status changes and a full evaluation from OT is needed.  Thank you for the referral.   Chrys Racer, OTR/L ascom [phone] 12/19/17, 9:25 AM

## 2017-12-19 NOTE — Progress Notes (Signed)
New hospice home referral received from [facility] following a palliative Medicine consult. Patient is a 72 year old man with a known history of metastatic bladder cancer. He was admitted to [facility] on 8/23 from home, found to have bilateral pneumonia and Klebsiella bacteriemia. Acute encephalopathy thought to be from brain metastases. Palliative medicine was consulted for goals of care and met this morning with patient's family. They wish to focus on comfort with transfer tot he hospice home.  Writer met in the room with patient's son Gerald Stabs, his daughter-in-law Claiborne Billings and ex wife Steward Drone to initiate education regarding hospice services, philosophy and team approach to care with understanding voiced. Questions answered.  Patient seen lying in bed, did not awaken during visit. He has been requiring liquid lorazepam  and liquid morphine for symptom management. Family and hospital care team aware that there is not currently any bed avail for transfer today. Patient information faxed to referral. Patient placed on the hospice home wait list. Thank you for the opportunity to be involved in the care of this patient and his family. Flo Shanks RN, [facility], hospital liaison [phone]

## 2017-12-19 NOTE — Care Management Important Message (Signed)
Important Message  Patient Details  Name: Gabriel Robbins MRN: [id number] Date of Birth: Nov 01, 1945   Medicare Important Message Given:  Yes    Juliann Pulse A Auther Lyerly 12/19/2017, 11:47 AM

## 2017-12-19 NOTE — Progress Notes (Signed)
[place]  Telephone:([phone] Fax:(336) [phone]  ID: Gabriel Robbins OB: 18-Jul-1945  MR#: [id number]  TDD#:[id number]  Patient Care Team: [name], NP as PCP - General (Family Medicine)  CHIEF COMPLAINT: Metastatic urothelial carcinoma, pneumonia with Klebsiella bacteremia.  INTERVAL HISTORY: Patient declining and unresponsive.  Family at bedside.  Plan is to transfer to hospice home today or tomorrow.  REVIEW OF SYSTEMS:   Review of Systems  Unable to perform ROS: Critical illness    PAST MEDICAL HISTORY: Past Medical History:  Diagnosis Date  . AAA (abdominal aortic aneurysm) without rupture ([place]) 03/2017   AAA status post endovascular repair Dec 2018  . Allergy   . Atrial fibrillation ([place])    on amiodarone  . Bladder cancer [facility])    s/p cystectomy with external urostomy bag  . Coronary artery disease   . Depression   . H/O aortic valve replacement 2011  . Myocardial infarction ([place])   . Prostate cancer ([place])     PAST SURGICAL HISTORY: Past Surgical History:  Procedure Laterality Date  . BLADDER SURGERY    . CARDIAC SURGERY    . PORTA CATH INSERTION N/A 09/22/2016   Procedure: Glori Luis Cath Insertion;  Surgeon: Algernon Huxley, MD;  Location: [facility];  Service: Cardiovascular;  Laterality: N/A;    FAMILY HISTORY: Family History  Problem Relation Age of Onset  . Thyroid disease Mother   . Heart attack Father   . Heart disease Brother   . Parkinson's disease Brother     ADVANCED DIRECTIVES (Y/N):  @ADVDIR @  HEALTH MAINTENANCE: Social History   Tobacco Use  . Smoking status: Former Smoker    Packs/day: 0.50    Years: 50.00    Pack years: 25.00  . Smokeless tobacco: Never Used  Substance Use Topics  . Alcohol use: Yes    Comment: very rare  . Drug use: No     Colonoscopy:  PAP:  Bone density:  Lipid panel:  Allergies  Allergen Reactions  . Penicillin G Other (See Comments)    Current  Facility-Administered Medications  Medication Dose Route Frequency Provider Last Rate Last Dose  . acetaminophen (TYLENOL) tablet 650 mg  650 mg Oral Q6H PRN Mayo, Pete Pelt, MD       Or  . acetaminophen (TYLENOL) suppository 650 mg  650 mg Rectal Q6H PRN Mayo, Pete Pelt, MD      . albuterol (PROVENTIL) (2.5 MG/3ML) 0.083% nebulizer solution 2.5 mg  2.5 mg Inhalation Q6H PRN Mayo, Pete Pelt, MD   2.5 mg at 12/18/17 0246  . ALPRAZolam Duanne Moron) tablet 2 mg  2 mg Oral Once PRN Hillary Bow, MD      . cefdinir (OMNICEF) 125 MG/5ML suspension 300 mg  300 mg Oral BID Hillary Bow, MD   300 mg at 12/18/17 2200  . chlorhexidine (PERIDEX) 0.12 % solution 15 mL  15 mL Mouth Rinse BID Sudini, Srikar, MD      . diclofenac sodium (VOLTAREN) 1 % transdermal gel 4 g  4 g Topical QID PRN Mayo, Pete Pelt, MD      . glycopyrrolate (ROBINUL) injection 0.2 mg  0.2 mg Intravenous Q4H PRN Demetrios Loll, MD      . guaiFENesin (ROBITUSSIN) 100 MG/5ML solution 100-200 mg  5-10 mL Oral Q4H PRN Sela Hua, MD   100 mg at 12/18/17 0531  . LORazepam (ATIVAN) 2 MG/ML concentrated solution 4 mg  4 mg Oral Q2H PRN Hillary Bow, MD  4 mg at 12/19/17 1749  . MEDLINE mouth rinse  15 mL Mouth Rinse q12n4p Hillary Bow, MD   15 mL at 12/18/17 2201  . morphine CONCENTRATE 10 MG/0.5ML oral solution 20 mg  20 mg Oral Q2H PRN Hillary Bow, MD   20 mg at 12/19/17 1548  . nystatin (MYCOSTATIN) 100000 UNIT/ML suspension 500,000 Units  5 mL Mouth/Throat QID Hillary Bow, MD   500,000 Units at 12/18/17 2200  . prochlorperazine (COMPAZINE) tablet 5 mg  5 mg Oral BID PRN Mayo, Pete Pelt, MD      . QUEtiapine (SEROQUEL) tablet 25 mg  25 mg Oral BID Hillary Bow, MD   25 mg at 12/18/17 2159  . senna-docusate (Senokot-S) tablet 2 tablet  2 tablet Oral BID PRN Mayo, Pete Pelt, MD       Facility-Administered Medications Ordered in Other Encounters  Medication Dose Route Frequency Provider Last Rate Last Dose  . heparin lock flush  100 unit/mL  500 Units Intravenous Once [name], MD      . sodium chloride flush (NS) 0.9 % injection 10 mL  10 mL Intravenous PRN [name], MD   10 mL at 02/02/17 0851    OBJECTIVE: Vitals:   12/19/17 0745 12/19/17 0747  BP:    Pulse:    Resp:    Temp:    SpO2: (!) 64% 92%     Body mass index is 28.12 kg/m.    ECOG FS:4 - Bedbound  General: Unresponsive. HEENT: Normocephalic. Neuro: Unresponsive. Skin: No rashes or petechiae noted.   LAB RESULTS:  Lab Results  Component Value Date   NA 143 12/17/2017   K 4.0 12/17/2017   CL 110 12/17/2017   CO2 25 12/17/2017   GLUCOSE 134 (H) 12/17/2017   BUN 32 (H) 12/17/2017   CREATININE 1.50 (H) 12/17/2017   CALCIUM 7.6 (L) 12/17/2017   PROT 5.5 (L) 12/17/2017   ALBUMIN 2.0 (L) 12/17/2017   AST 24 12/17/2017   ALT 12 12/17/2017   ALKPHOS 240 (H) 12/17/2017   BILITOT 0.5 12/17/2017   GFRNONAA 45 (L) 12/17/2017   GFRAA 52 (L) 12/17/2017    Lab Results  Component Value Date   WBC 13.0 (H) 12/18/2017   NEUTROABS 12.6 (H) 12/18/2017   HGB 8.0 (L) 12/18/2017   HCT 25.1 (L) 12/18/2017   MCV 78.6 (L) 12/18/2017   PLT COUNT MAY BE INACCURATE DUE TO FIBRIN CLUMPS. 12/18/2017     STUDIES: Ct Head Wo Contrast  Result Date: 12/17/2017 CLINICAL DATA:  Altered mental status. Acute encephalopathy with progression over the last few weeks. EXAM: CT HEAD WITHOUT CONTRAST TECHNIQUE: Contiguous axial images were obtained from the base of the skull through the vertex without intravenous contrast. COMPARISON:  None. FINDINGS: Brain: Imaging of the brain is nonspecific. Remote white matter infarct is present anteriorly in the left frontal lobe. Basal ganglia are intact. Remote lacunar infarcts are noted in the left cerebellum. Mild generalized atrophy and white matter disease is present bilaterally. No acute infarct, hemorrhage, or mass lesion is present. Ventricles are proportionate to the degree of atrophy. No significant  extra-axial fluid collection is present. Vascular: Atherosclerotic calcifications are present within the cavernous internal carotid arteries and at the dural margin of both vertebral arteries. Skull: Calvarium is intact. No focal lytic or blastic lesions are present. Sinuses/Orbits: A polyp or mucous retention cyst is present inferiorly in the left maxillary sinus. There is wall thickening suggests a history of chronic disease in the  left maxillary sinus. No acute fluid levels are present. The paranasal sinuses and the mastoid air cells are otherwise clear. Globes and orbits are within normal limits. IMPRESSION: 1. Nonspecific appearance of mild generalized atrophy and white matter disease. This likely reflects the sequela of chronic microvascular ischemia. 2. 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 3. Remote lacunar infarcts in the left cerebellum. Electronically Signed   By: San Morelle M.D.   On: 12/17/2017 14:32   Dg Chest Port 1 View  Result Date: 12/10/2017 CLINICAL DATA:  72 y/o M; hypotension and shortness of breath today. EXAM: PORTABLE CHEST 1 VIEW COMPARISON:  06/09/2017 CT chest. FINDINGS: Normal cardiac silhouette. Aortic atherosclerosis with calcification. Post median sternotomy with multiple broken wires. Reticular and peripheral linear opacities. No consolidation. Emphysema with upper lobe predominance. No pleural effusion or pneumothorax. No acute osseous abnormality is evident. IMPRESSION: Reticular opacities, likely interstitial edema, possibly atypical pneumonia. Pulmonary nodules are better assessed with CT of the chest when clinically indicated. Electronically Signed   By: Kristine Garbe M.D.   On: 12/15/2017 15:34    ASSESSMENT: Metastatic urothelial carcinoma, pneumonia with Klebsiella bacteremia  PLAN:    1. Metastatic urothelial carcinoma, pneumonia with Klebsiella bacteremia: Given patient's declining condition agree with transfer to  hospice home.  Case discussed with family at bedside.  No further intervention is needed.  I spent a total of 30 minutes face-to-face with the patient of which greater than 50% of the visit was spent in counseling and coordination of care as detailed above.   [name], MD   12/19/2017 9:03 PM

## 2017-12-19 NOTE — Progress Notes (Signed)
   12/19/17 1235  Clinical Encounter Type  Visited With Patient and family together  Visit Type Initial;Spiritual support  Referral From Nurse  Consult/Referral To Chaplain  Spiritual Encounters  Spiritual Needs Prayer;Emotional   [place] visited with the patient and his family wile rounding on 1C. The patient was sleeping throughout the visit. Family shared about Mr. Kissoon working career and his hopes of spending more time with his grandchildren. His son who was bedside shred the most about the patient's life. The family is waiting for a visit from social work to discuss a hospice placement. I will follow up if needed.

## 2017-12-19 NOTE — Clinical Social Work Note (Signed)
CSW notified that patient will need Hospice home. CSW met with patient's son Nolene Ebbs at bedside. Son states that he and his family would like patient to go to [facility] hospice home. CSW explained the process and waiting for a bed to open. Family is in agreement. CSW notified Santiago Glad, hospice liaison of referral. CSW will continue to follow for discharge planning.   [place], [place]

## 2017-12-19 NOTE — Progress Notes (Signed)
[place] [facility] [place] NAME: Drayden Lukas    MR#:  [id number]  DATE OF BIRTH:  1945-06-09  SUBJECTIVE:  CHIEF COMPLAINT:   Chief Complaint  Patient presents with  . Chest Pain  . Hypotension   Confused and restless.  On Oxygen. Afebrile Family at bedside  REVIEW OF SYSTEMS:    Review of Systems  Unable to perform ROS: Mental status change    DRUG ALLERGIES:   Allergies  Allergen Reactions  . Penicillin G Other (See Comments)    VITALS:  Blood pressure 104/63, pulse 91, temperature (!) 97.5 F (36.4 C), temperature source Axillary, resp. rate (!) 22, height 5\' 11"  (1.803 m), weight 91.4 kg, SpO2 92 %.  PHYSICAL EXAMINATION:   Physical Exam  GENERAL:  72 y.o.-year-old patient lying in the bed, criticall ill and in resp distress EYES: Pupils equal, round, reactive to light and accommodation. No scleral icterus. Extraocular muscles intact.  HEENT: Head atraumatic, normocephalic.  NECK:  Supple, no jugular venous distention LUNGS: Coase breath sounds CARDIOVASCULAR: S1, S2 n, tachycardia ABDOMEN: Soft, nontender, nondistended. Bowel sounds present. No organomegaly or mass.  Colostomy in place EXTREMITIES: No cyanosis, clubbing or edema b/l.    NEUROLOGIC: Moves all 4 extremities PSYCHIATRIC: Drowsy SKIN: Bruising  LABORATORY PANEL:   CBC Recent Labs  Lab 12/18/17 0422  WBC 13.0*  HGB 8.0*  HCT 25.1*  PLT COUNT MAY BE INACCURATE DUE TO FIBRIN CLUMPS.   ------------------------------------------------------------------------------------------------------------------ Chemistries  Recent Labs  Lab 12/17/17 0355 12/17/17 2000  NA 143  --   K 4.0  --   CL 110  --   CO2 25  --   GLUCOSE 134*  --   BUN 32*  --   CREATININE 1.50*  --   CALCIUM 7.6*  --   AST  --  24  ALT  --  12  ALKPHOS  --  240*  BILITOT  --  0.5    ------------------------------------------------------------------------------------------------------------------  Cardiac Enzymes Recent Labs  Lab 12/17/17 0355  TROPONINI 0.08*   ------------------------------------------------------------------------------------------------------------------  RADIOLOGY:  Ct Head Wo Contrast  Result Date: 12/17/2017 CLINICAL DATA:  Altered mental status. Acute encephalopathy with progression over the last few weeks. EXAM: CT HEAD WITHOUT CONTRAST TECHNIQUE: Contiguous axial images were obtained from the base of the skull through the vertex without intravenous contrast. COMPARISON:  None. FINDINGS: Brain: Imaging of the brain is nonspecific. Remote white matter infarct is present anteriorly in the left frontal lobe. Basal ganglia are intact. Remote lacunar infarcts are noted in the left cerebellum. Mild generalized atrophy and white matter disease is present bilaterally. No acute infarct, hemorrhage, or mass lesion is present. Ventricles are proportionate to the degree of atrophy. No significant extra-axial fluid collection is present. Vascular: Atherosclerotic calcifications are present within the cavernous internal carotid arteries and at the dural margin of both vertebral arteries. Skull: Calvarium is intact. No focal lytic or blastic lesions are present. Sinuses/Orbits: A polyp or mucous retention cyst is present inferiorly in the left maxillary sinus. There is wall thickening suggests a history of chronic disease in the left maxillary sinus. No acute fluid levels are present. The paranasal sinuses and the mastoid air cells are otherwise clear. Globes and orbits are within normal limits. IMPRESSION: 1. Nonspecific appearance of mild generalized atrophy and white matter disease. This likely reflects the sequela of chronic microvascular ischemia. 2. 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 3. Remote lacunar infarcts in the  left cerebellum.  Electronically Signed   By: San Morelle M.D.   On: 12/17/2017 14:32     ASSESSMENT AND PLAN:   *Acute anemia and thrombocytopenia.  No labs today  *Bilateral pneumonia with Klebsiella bacteremia and sepsis present on admission No improvement inspite of IV abx  * Acute hypoxic resp failure due to PNA Worsening  *Bladder cancer with wide metastasis Worsening in spite of aggressive treatment at the [place] according to son.   Discussed with Dr. Tasia Catchings of oncology.    *Acute encephalopathy.  Progressive over the past few weeks.  Likely due to his brain mets and acute illness. CT scan of the head checked and showed no acute hemorrhage  *Chronic low back pain.  Pain medications added.  *Atrial fibrillation on amiodarone  Deteriorating rapidly. Decision made to transition to comfort measures. Transfer to hospice home  DO NOT RESUSCITATE and DO NOT INTUBATE.  All the records are reviewed and case discussed with Care Management/Social Worker Management plans discussed with the patient, family and they are in agreement.  CODE STATUS: DNR/DNI  DVT Prophylaxis: SCDs  TOTAL TIME TAKING CARE OF THIS PATIENT: 35 minutes.   Leia Alf Thelma Viana M.D on 12/19/2017 at 11:11 AM  Between 7am to 6pm - Pager - [phone]  After 6pm go to [email or web address] - password [email or web address] [facility]  Office  [phone]  CC: Primary care physician; [name], NP  Note: This dictation was prepared with Dragon dictation along with smaller phrase technology. Any transcriptional errors that result from this process are unintentional.

## 2017-12-20 LAB — HAPTOGLOBIN: Haptoglobin: 35 mg/dL (ref 34–200)

## 2017-12-25 NOTE — Progress Notes (Signed)
Pts work of breathing became progressively worse, and morphine was given on 2 separate occasions.  Ativan was given once due to agitation.  Family at bedside very supportive and caring.  Pt expired at [place] with family and 2 nurses at bedside.  MD, chaplain, nursing supervisor were all contacted.  Donor services denied referral.  Gold ring and glasses sent home with son, Gabriel Robbins and dentures left here to be sent with funeral home upon their arrival.

## 2017-12-25 NOTE — Progress Notes (Signed)
   07-Jan-2018 0020  Clinical Encounter Type  Visited With Family  Visit Type Death  Referral From Nurse   Provided pastoral presence and grief support to family upon patient death.  Son, ex-wife, and family friend were present.  Chaplain stayed to support family until they were ready to leave the hospital.

## 2017-12-25 DEATH — deceased

## 2018-01-24 NOTE — Death Summary Note (Signed)
DEATH SUMMARY   Patient Details  Name: Gabriel Robbins MRN: [id number] DOB: 1945-12-01  Admission/Discharge Information   Admit Date:  2017-12-23  Date of Death: Date of Death: 12/27/2017  Time of Death: Time of Death: 07-09-22  Length of Stay: 3  Referring Physician: [name], NP   Reason(s) for Hospitalization   Pneumonia   Diagnoses  Preliminary cause of death:   bacteremia Secondary Diagnoses (including complications and co-morbidities):  Active Problems:   HCAP (healthcare-associated pneumonia)   Ventricular tachycardia ([name])   Bacteremia   Sepsis ([name])   History of bladder cancer   Microcytic anemia   Thrombocytopenia ([name])   Palliative care by specialist   Goals of care, counseling/discussion   Atypical pneumonia   Acute kidney injury ([name])   Anxiety state   Dyspnea   Cancer related pain   Brief Hospital Course (including significant findings, care, treatment, and services provided and events leading to death)  *Acute anemia and thrombocytopenia.  *Bilateral pneumonia with Klebsiella bacteremia and sepsis present on admission * Acute hypoxic resp failure due to PNA *Bladder cancer with wide metastasis *Acute encephalopathy.  Progressive over the past few weeks. *Chronic low back pain.   *Atrial fibrillation on amiodarone  Patient was admitted to medical floor with broad-spectrum IV antibiotics.  Later his blood cultures returned with Klebsiella.  Discussed with pharmacy and antibiotics changed.  With aggressive stage IV bladder cancer which has been worsening quickly patient deteriorated in spite of aggressive treatment.  After discussing with family and palliative care patient was transitioned to comfort measures.  Was pronounced dead on December 27, 2017 at [name].    Pertinent Labs and Studies  Significant Diagnostic Studies Ct Head Wo Contrast  Result Date: 12/17/2017 CLINICAL DATA:  Altered mental status. Acute encephalopathy with progression over the  last few weeks. EXAM: CT HEAD WITHOUT CONTRAST TECHNIQUE: Contiguous axial images were obtained from the base of the skull through the vertex without intravenous contrast. COMPARISON:  None. FINDINGS: Brain: Imaging of the brain is nonspecific. Remote white matter infarct is present anteriorly in the left frontal lobe. Basal ganglia are intact. Remote lacunar infarcts are noted in the left cerebellum. Mild generalized atrophy and white matter disease is present bilaterally. No acute infarct, hemorrhage, or mass lesion is present. Ventricles are proportionate to the degree of atrophy. No significant extra-axial fluid collection is present. Vascular: Atherosclerotic calcifications are present within the cavernous internal carotid arteries and at the dural margin of both vertebral arteries. Skull: Calvarium is intact. No focal lytic or blastic lesions are present. Sinuses/Orbits: A polyp or mucous retention cyst is present inferiorly in the left maxillary sinus. There is wall thickening suggests a history of chronic disease in the left maxillary sinus. No acute fluid levels are present. The paranasal sinuses and the mastoid air cells are otherwise clear. Globes and orbits are within normal limits. IMPRESSION: 1. Nonspecific appearance of mild generalized atrophy and white matter disease. This likely reflects the sequela of chronic microvascular ischemia. 2. 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 3. Remote lacunar infarcts in the left cerebellum. Electronically Signed   By: San Morelle M.D.   On: 12/17/2017 14:32   Dg Chest Port 1 View  Result Date: 12-23-2017 CLINICAL DATA:  72 y/o M; hypotension and shortness of breath today. EXAM: PORTABLE CHEST 1 VIEW COMPARISON:  06/09/2017 CT chest. FINDINGS: Normal cardiac silhouette. Aortic atherosclerosis with calcification. Post median sternotomy with multiple broken wires. Reticular and peripheral linear opacities. No consolidation.  Emphysema with upper lobe predominance. No pleural effusion or pneumothorax. No acute osseous abnormality is evident. IMPRESSION: Reticular opacities, likely interstitial edema, possibly atypical pneumonia. Pulmonary nodules are better assessed with CT of the chest when clinically indicated. Electronically Signed   By: Kristine Garbe M.D.   On: 12/05/2017 15:34    Microbiology No results found for this or any previous visit (from the past 240 hour(s)).  Lab Basic Metabolic Panel: No results for input(s): NA, K, CL, CO2, GLUCOSE, BUN, CREATININE, CALCIUM, MG, PHOS in the last 168 hours. Liver Function Tests: No results for input(s): AST, ALT, ALKPHOS, BILITOT, PROT, ALBUMIN in the last 168 hours. No results for input(s): LIPASE, AMYLASE in the last 168 hours. No results for input(s): AMMONIA in the last 168 hours. CBC: No results for input(s): WBC, NEUTROABS, HGB, HCT, MCV, PLT in the last 168 hours. Cardiac Enzymes: No results for input(s): CKTOTAL, CKMB, CKMBINDEX, TROPONINI in the last 168 hours. Sepsis Labs: No results for input(s): PROCALCITON, WBC, LATICACIDVEN in the last 168 hours.  Procedures/Operations     Gabriel Robbins Gabriel Robbins 01/01/2018, 11:43 AM

## 2019-04-27 IMAGING — CT CT HEAD W/O CM
3 series · 15 of 47 positions shown, 18 images · non-contrast
Comparison: None.

CLINICAL DATA: Altered mental status. Acute encephalopathy with
progression over the last few weeks.

EXAM:
CT HEAD WITHOUT CONTRAST
TECHNIQUE: Contiguous axial images were obtained from the base of the skull
through the vertex without intravenous contrast.

[Series 2: head wo · axial · 0.47mm/px · z∈[-149,-14]mm · 9 of 33 slices shown, 12 images]
[im 3/33  brain]
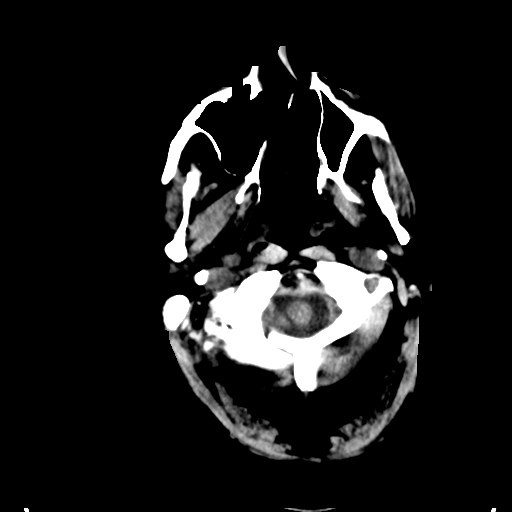
[im 3/33  bone]
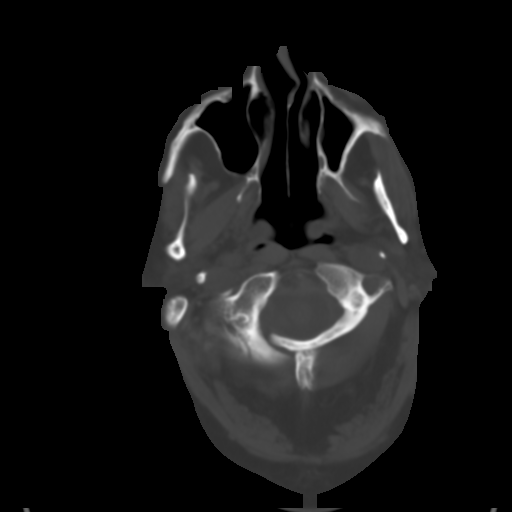
[im 6/33  brain]
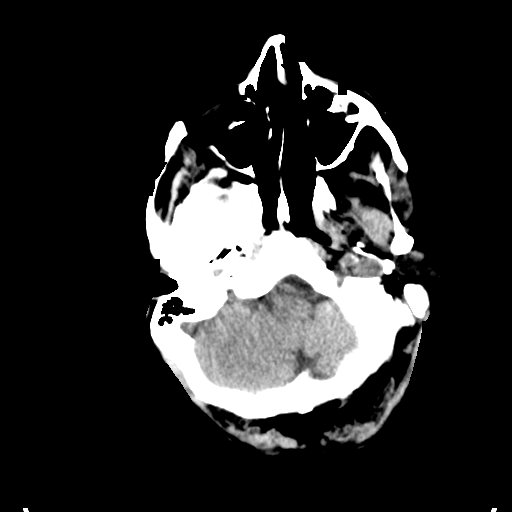
[im 9/33  brain]
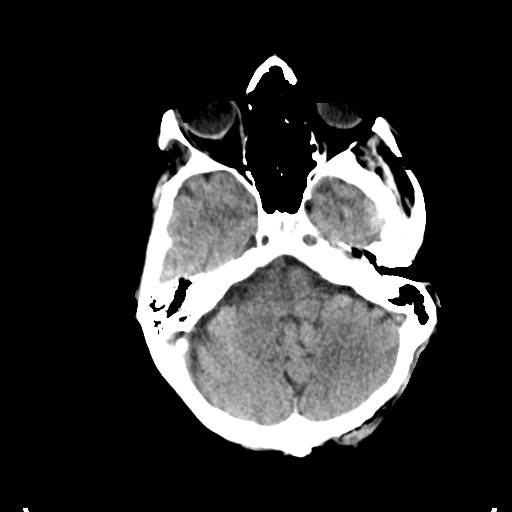
[im 13/33  brain]
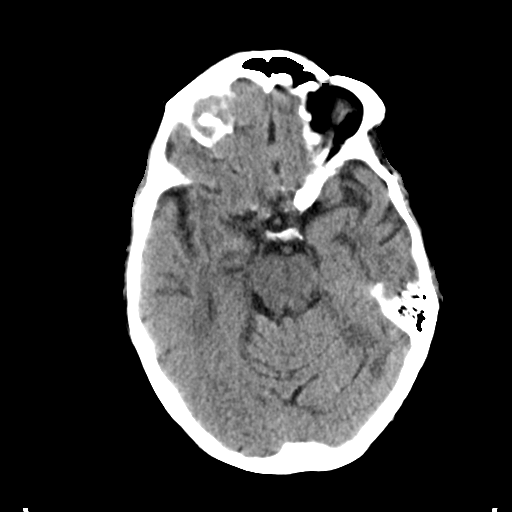
[im 17/33  brain]
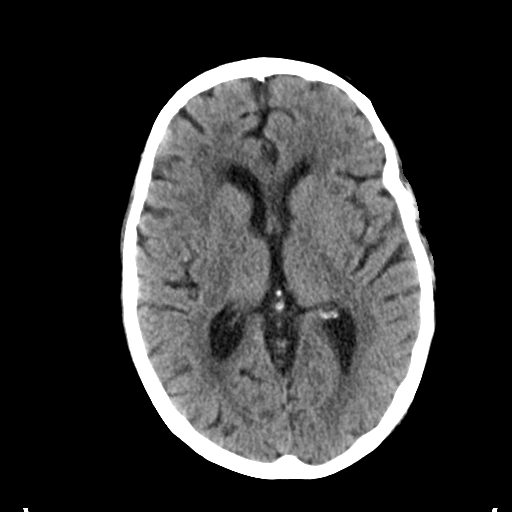
[im 17/33  bone]
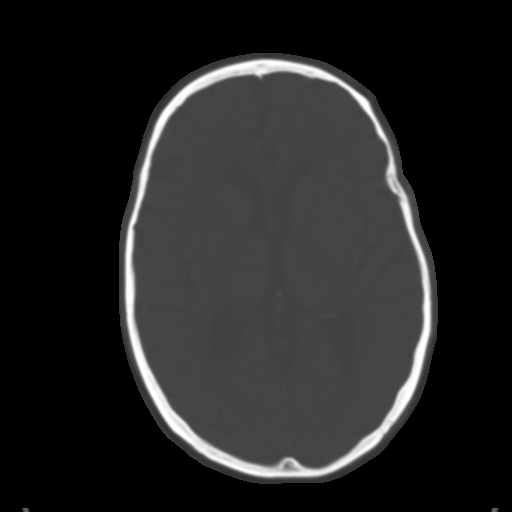
[im 20/33  brain]
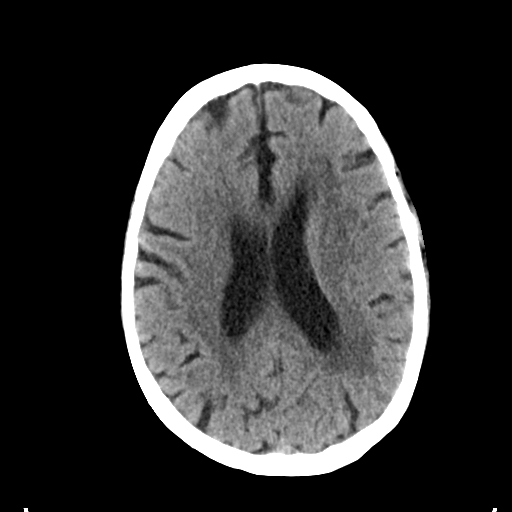
[im 24/33  brain]
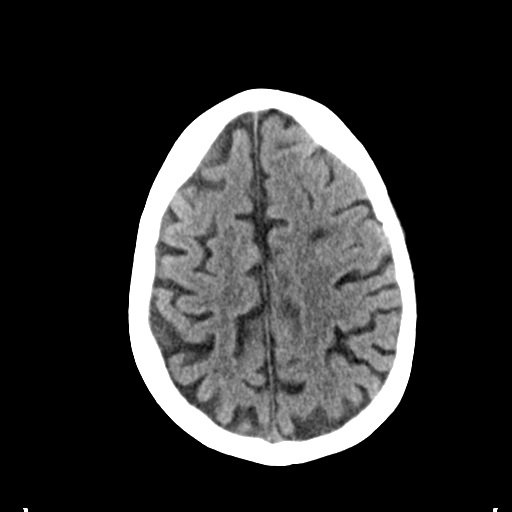
[im 27/33  brain]
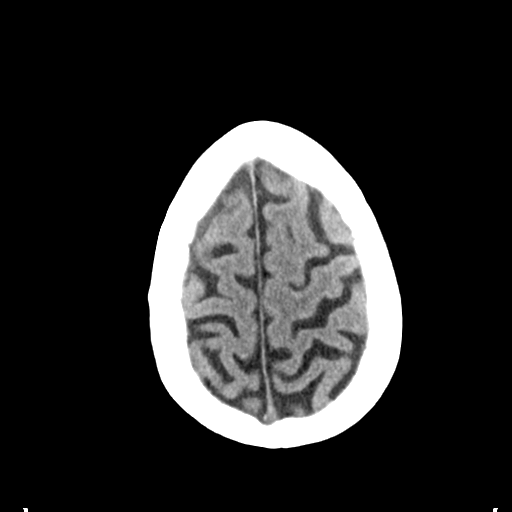
[im 30/33  brain]
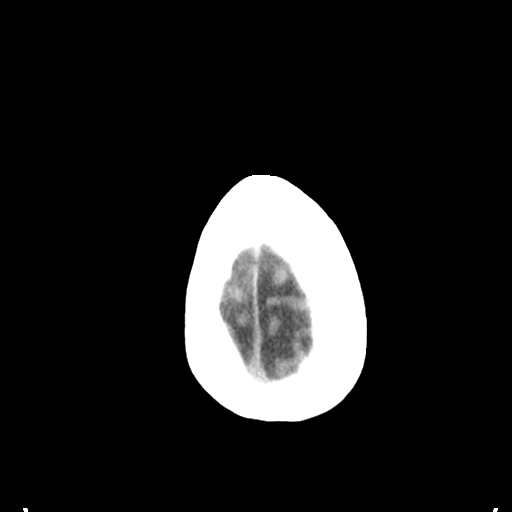
[im 30/33  bone]
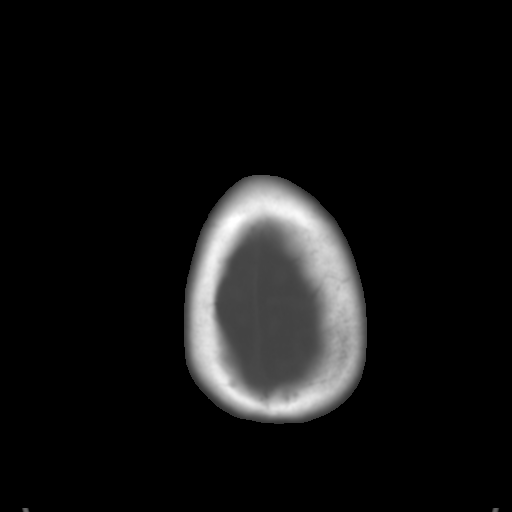

[Series 4: coronal soft tissue · coronal · 0.31mm/px · 3 of 72 slices shown]
[im 24/72  brain]
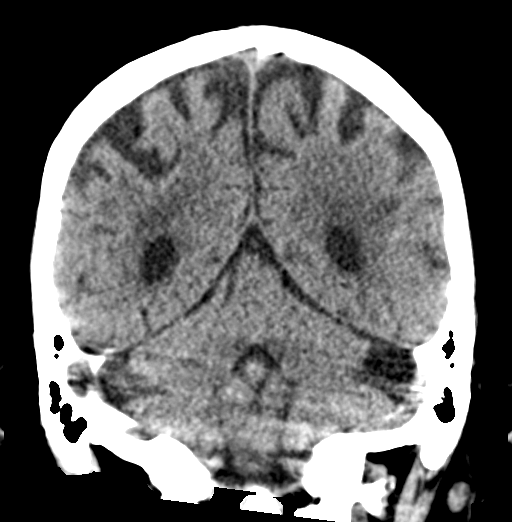
[im 32/72  brain]
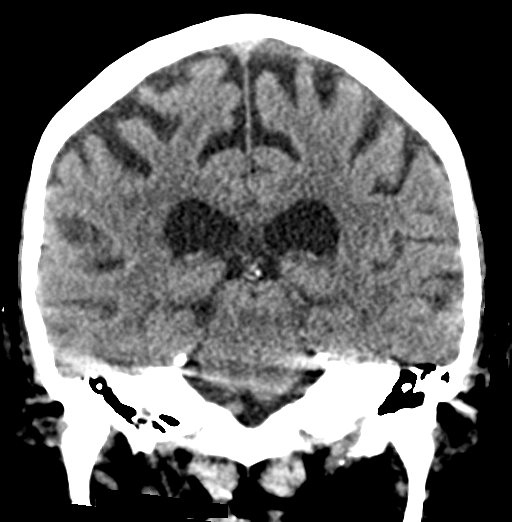
[im 40/72  brain]
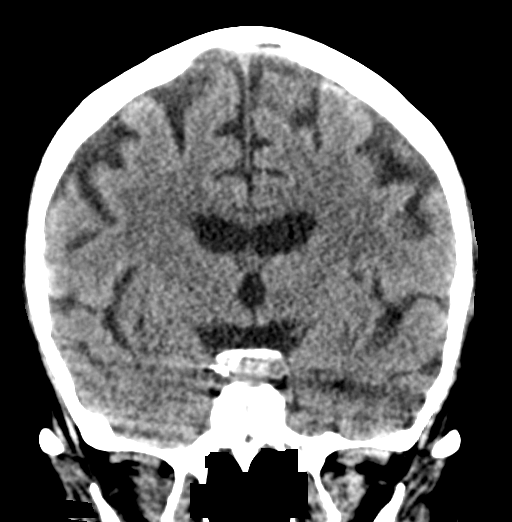

[Series 5: sagittal soft tissue · sagittal · 0.32mm/px · 3 of 54 slices shown]
[im 19/54  brain]
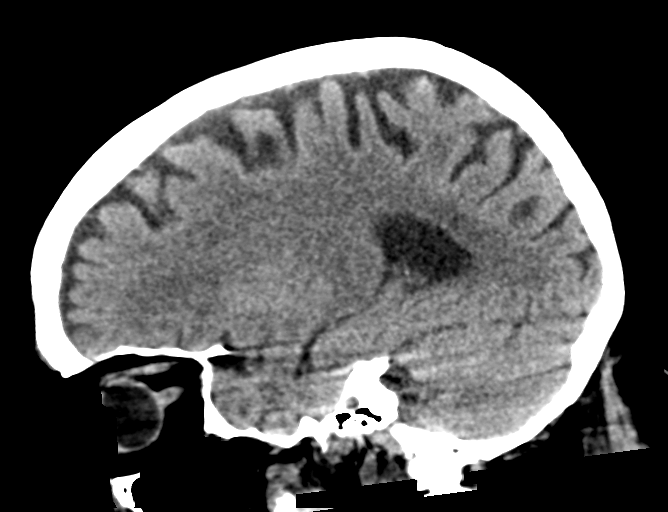
[im 27/54  brain]
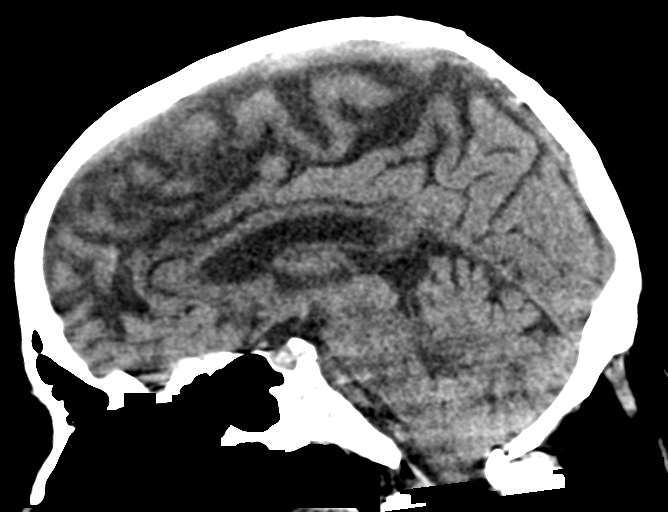
[im 35/54  brain]
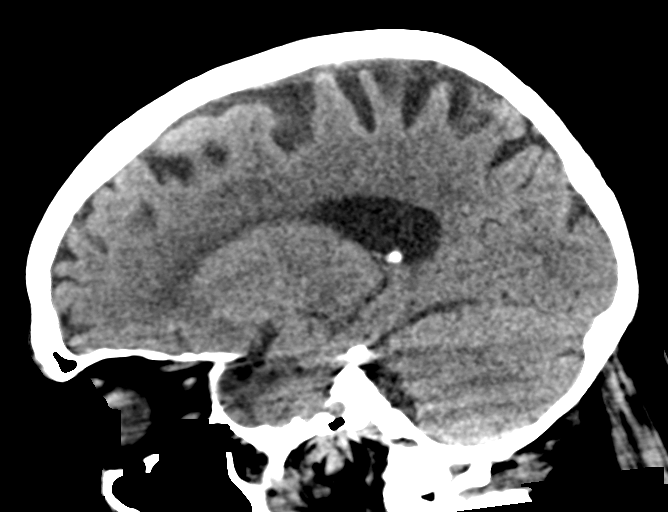

[15 of 47 positions shown; findings below may reference images not displayed]

FINDINGS: Brain: Imaging of the brain is nonspecific. Remote white matter
infarct is present anteriorly in the left frontal lobe. Basal
ganglia are intact. Remote lacunar infarcts are noted in the left
cerebellum. Mild generalized atrophy and white matter disease is
present bilaterally.

No acute infarct, hemorrhage, or mass lesion is present. Ventricles
are proportionate to the degree of atrophy. No significant
extra-axial fluid collection is present.

Vascular: Atherosclerotic calcifications are present within the
cavernous internal carotid arteries and at the dural margin of both
vertebral arteries.

Skull: Calvarium is intact. No focal lytic or blastic lesions are
present.

Sinuses/Orbits: A polyp or mucous retention cyst is present
inferiorly in the left maxillary sinus. There is wall thickening
suggests a history of chronic disease in the left maxillary sinus.
No acute fluid levels are present. The paranasal sinuses and the
mastoid air cells are otherwise clear. Globes and orbits are within
normal limits.
IMPRESSION: 1. Nonspecific appearance of mild generalized atrophy and white
matter disease. This likely reflects the sequela of chronic
microvascular ischemia.
2. Remote white matter infarct of the anterior left frontal lobe.
3. Remote lacunar infarcts in the left cerebellum.
